# Patient Record
Sex: Female | Born: 1960 | Race: Black or African American | Hispanic: No | Marital: Single | Smoking: Never smoker
Health system: Southern US, Community
[De-identification: ages and names within clinical notes are randomized; demographics above are authoritative.]

## PROBLEM LIST (undated history)

## (undated) DIAGNOSIS — R42 Dizziness and giddiness: Secondary | ICD-10-CM

## (undated) DIAGNOSIS — N390 Urinary tract infection, site not specified: Secondary | ICD-10-CM

## (undated) DIAGNOSIS — J329 Chronic sinusitis, unspecified: Secondary | ICD-10-CM

## (undated) DIAGNOSIS — I639 Cerebral infarction, unspecified: Secondary | ICD-10-CM

## (undated) DIAGNOSIS — E785 Hyperlipidemia, unspecified: Secondary | ICD-10-CM

## (undated) HISTORY — PX: UTERINE FIBROID SURGERY: SHX826

---

## 2015-01-03 ENCOUNTER — Emergency Department (HOSPITAL_COMMUNITY): Payer: Medicaid Other

## 2015-01-03 ENCOUNTER — Inpatient Hospital Stay (HOSPITAL_COMMUNITY)
Admission: EM | Admit: 2015-01-03 | Discharge: 2015-01-05 | DRG: 065 | Disposition: A | Discharge status: Home / Self Care | Payer: Medicaid Other | Attending: Family Medicine | Admitting: Family Medicine

## 2015-01-03 ENCOUNTER — Encounter (HOSPITAL_COMMUNITY): Payer: Self-pay | Admitting: Emergency Medicine

## 2015-01-03 ENCOUNTER — Inpatient Hospital Stay (HOSPITAL_COMMUNITY): Payer: Medicaid Other

## 2015-01-03 DIAGNOSIS — Z823 Family history of stroke: Secondary | ICD-10-CM

## 2015-01-03 DIAGNOSIS — I63512 Cerebral infarction due to unspecified occlusion or stenosis of left middle cerebral artery: Principal | ICD-10-CM | POA: Diagnosis present

## 2015-01-03 DIAGNOSIS — G8191 Hemiplegia, unspecified affecting right dominant side: Secondary | ICD-10-CM | POA: Diagnosis present

## 2015-01-03 DIAGNOSIS — Z809 Family history of malignant neoplasm, unspecified: Secondary | ICD-10-CM | POA: Diagnosis not present

## 2015-01-03 DIAGNOSIS — I6521 Occlusion and stenosis of right carotid artery: Secondary | ICD-10-CM | POA: Diagnosis present

## 2015-01-03 DIAGNOSIS — I6529 Occlusion and stenosis of unspecified carotid artery: Secondary | ICD-10-CM

## 2015-01-03 DIAGNOSIS — R4781 Slurred speech: Secondary | ICD-10-CM | POA: Diagnosis present

## 2015-01-03 DIAGNOSIS — B373 Candidiasis of vulva and vagina: Secondary | ICD-10-CM | POA: Diagnosis present

## 2015-01-03 DIAGNOSIS — I639 Cerebral infarction, unspecified: Secondary | ICD-10-CM | POA: Diagnosis present

## 2015-01-03 DIAGNOSIS — E785 Hyperlipidemia, unspecified: Secondary | ICD-10-CM | POA: Diagnosis present

## 2015-01-03 DIAGNOSIS — R4701 Aphasia: Secondary | ICD-10-CM | POA: Diagnosis present

## 2015-01-03 HISTORY — DX: Dizziness and giddiness: R42

## 2015-01-03 HISTORY — DX: Hyperlipidemia, unspecified: E78.5

## 2015-01-03 LAB — DIFFERENTIAL
BASOS ABS: 0 10*3/uL (ref 0.0–0.1)
BASOS PCT: 1 %
Eosinophils Absolute: 0.2 10*3/uL (ref 0.0–0.7)
Eosinophils Relative: 3 %
Lymphocytes Relative: 44 %
Lymphs Abs: 2.1 10*3/uL (ref 0.7–4.0)
MONOS PCT: 5 %
Monocytes Absolute: 0.3 10*3/uL (ref 0.1–1.0)
NEUTROS ABS: 2.2 10*3/uL (ref 1.7–7.7)
Neutrophils Relative %: 47 %

## 2015-01-03 LAB — RAPID URINE DRUG SCREEN, HOSP PERFORMED
AMPHETAMINES: NOT DETECTED
BARBITURATES: NOT DETECTED
BENZODIAZEPINES: NOT DETECTED
Cocaine: NOT DETECTED
Opiates: NOT DETECTED
TETRAHYDROCANNABINOL: NOT DETECTED

## 2015-01-03 LAB — I-STAT CHEM 8, ED
BUN: 11 mg/dL (ref 6–20)
CHLORIDE: 104 mmol/L (ref 101–111)
CREATININE: 0.8 mg/dL (ref 0.44–1.00)
Calcium, Ion: 1.18 mmol/L (ref 1.12–1.23)
GLUCOSE: 115 mg/dL — AB (ref 65–99)
HCT: 40 % (ref 36.0–46.0)
Hemoglobin: 13.6 g/dL (ref 12.0–15.0)
POTASSIUM: 3.7 mmol/L (ref 3.5–5.1)
Sodium: 144 mmol/L (ref 135–145)
TCO2: 29 mmol/L (ref 0–100)

## 2015-01-03 LAB — CBC
HEMATOCRIT: 36.9 % (ref 36.0–46.0)
HEMOGLOBIN: 11.5 g/dL — AB (ref 12.0–15.0)
MCH: 23.4 pg — ABNORMAL LOW (ref 26.0–34.0)
MCHC: 31.2 g/dL (ref 30.0–36.0)
MCV: 75.2 fL — ABNORMAL LOW (ref 78.0–100.0)
Platelets: 371 10*3/uL (ref 150–400)
RBC: 4.91 MIL/uL (ref 3.87–5.11)
RDW: 14.8 % (ref 11.5–15.5)
WBC: 4.8 10*3/uL (ref 4.0–10.5)

## 2015-01-03 LAB — COMPREHENSIVE METABOLIC PANEL
ALT: 19 U/L (ref 14–54)
AST: 16 U/L (ref 15–41)
Albumin: 3.8 g/dL (ref 3.5–5.0)
Alkaline Phosphatase: 65 U/L (ref 38–126)
Anion gap: 7 (ref 5–15)
BUN: 12 mg/dL (ref 6–20)
CHLORIDE: 105 mmol/L (ref 101–111)
CO2: 28 mmol/L (ref 22–32)
CREATININE: 0.83 mg/dL (ref 0.44–1.00)
Calcium: 8.9 mg/dL (ref 8.9–10.3)
GFR calc Af Amer: >60 mL/min (ref >60)
Glucose, Bld: 116 mg/dL — ABNORMAL HIGH (ref 65–99)
Potassium: 3.5 mmol/L (ref 3.5–5.1)
Sodium: 140 mmol/L (ref 135–145)
Total Bilirubin: 0.3 mg/dL (ref 0.3–1.2)
Total Protein: 7.5 g/dL (ref 6.5–8.1)

## 2015-01-03 LAB — I-STAT TROPONIN, ED: TROPONIN I, POC: 0 ng/mL (ref 0.00–0.08)

## 2015-01-03 LAB — APTT: APTT: 29 s (ref 24–37)

## 2015-01-03 LAB — URINE MICROSCOPIC-ADD ON
BACTERIA UA: NONE SEEN
RBC / HPF: NONE SEEN RBC/hpf (ref 0–5)
Squamous Epithelial / LPF: NONE SEEN

## 2015-01-03 LAB — URINALYSIS, ROUTINE W REFLEX MICROSCOPIC
Bilirubin Urine: NEGATIVE
GLUCOSE, UA: NEGATIVE mg/dL
HGB URINE DIPSTICK: NEGATIVE
Ketones, ur: NEGATIVE mg/dL
Nitrite: NEGATIVE
PH: 5.5 (ref 5.0–8.0)
Protein, ur: NEGATIVE mg/dL
SPECIFIC GRAVITY, URINE: 1.01 (ref 1.005–1.030)

## 2015-01-03 LAB — PROTIME-INR
INR: 1.03 (ref 0.00–1.49)
Prothrombin Time: 13.7 seconds (ref 11.6–15.2)

## 2015-01-03 LAB — ETHANOL

## 2015-01-03 LAB — CBG MONITORING, ED: Glucose-Capillary: 99 mg/dL (ref 65–99)

## 2015-01-03 MED ORDER — ENOXAPARIN SODIUM 40 MG/0.4ML ~~LOC~~ SOLN
40.0000 mg | SUBCUTANEOUS | Status: DC
  Filled 2015-01-03: qty 0.4

## 2015-01-03 MED ORDER — LORAZEPAM 2 MG/ML IJ SOLN
1.0000 mg | Freq: Once | INTRAMUSCULAR | Status: AC
  Administered 2015-01-03: 1 mg via INTRAVENOUS
  Filled 2015-01-03: qty 1

## 2015-01-03 MED ORDER — STROKE: EARLY STAGES OF RECOVERY BOOK
Freq: Once | Status: DC
  Filled 2015-01-03: qty 1

## 2015-01-03 MED ORDER — ATORVASTATIN CALCIUM 40 MG PO TABS
40.0000 mg | ORAL_TABLET | Freq: Every day | ORAL | Status: DC
  Administered 2015-01-03: 40 mg via ORAL
  Filled 2015-01-03: qty 1

## 2015-01-03 MED ORDER — ASPIRIN 300 MG RE SUPP
300.0000 mg | Freq: Every day | RECTAL | Status: DC
Start: 2015-01-03 — End: 2015-01-04

## 2015-01-03 MED ORDER — ASPIRIN 325 MG PO TABS
325.0000 mg | ORAL_TABLET | Freq: Every day | ORAL | Status: DC
  Administered 2015-01-03 – 2015-01-05 (×3): 325 mg via ORAL
  Filled 2015-01-03 (×3): qty 1

## 2015-01-03 NOTE — H&P (Addendum)
History and Physical  Megan Holloway ZOX:[id number] DOB: [birth year] DOA: 01/03/2015  Referring physician: Glynn Octave, MD. PCP: No PCP Per Patient   Chief Complaint: Aphasia, Numbness.   HPI: 54 y/o female with a hx of vertigo presents with weakness in her right hand, numbness in her right thumb, and slurred speech. While in the ED, labs were unremarkable. 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 She will be admitted for further workup and management.   Patient states she woke up on 12/26 with numbness in her right hand. She then began to gradually develop worsening weakness in her right hand and was unable to grip anything. This morning upon waking up around 6:30am, she began having slurred speech. Upon arrival to the ED around 9am, she reports her speech was back to normal. She denies any LE weakness, HA, CP, difficulty swallowing, SOB, visual changes, nausea, vomiting, or abdominal pain.   In the emergency department VSS, afebrile, not hypoxic Pertinent labs:CMP, troponin, CBC, UDS, alcohol unremarkable  EKG: Independently reviewed. SR.  Review of Systems:  Positive for right thumb numbness, right hand weakness, slurred speech. Negative for fever, visual changes, sore throat, rash, new muscle aches, chest pain, SOB, dysuria, bleeding, n/v/abdominal pain.  Past Medical History  Diagnosis Date  . Vertigo     Past Surgical History  Procedure Laterality Date  . Uterine fibroid surgery    . Cesarean section      Social History:  reports that she has never smoked. She does not have any smokeless tobacco history on file. She reports that she does not drink alcohol or use illicit drugs. lives with their family Self-care  Allergies  Allergen Reactions  . Sulfa Antibiotics Hives  . Penicillins Rash    Has patient had a PCN reaction causing immediate rash,  facial/tongue/throat swelling, SOB or lightheadedness with hypotension: No Has patient had a PCN reaction causing severe rash involving mucus membranes or skin necrosis: No Has patient had a PCN reaction that required hospitalization No Has patient had a PCN reaction occurring within the last 10 years: No If all of the above answers are "NO", then may proceed with Cephalosporin use.     Family History  Problem Relation Age of Onset  . Stroke Mother   . Cancer Father   . Stroke Brother      Prior to Admission medications   Not on File   Physical Exam: Filed Vitals:   01/03/15 0900 01/03/15 0930 01/03/15 1133 01/03/15 1148  BP: 178/91 155/75 144/95   Pulse: 79 73 70   Temp:    98.3 F (36.8 C)  TempSrc:      Resp: 29 19 16    Height:      Weight:      SpO2: 99% 100% 100%     VSS, afebrile General:  Appears calm and comfortable Eyes: PERRL, normal lids, irises & conjunctiva ENT: grossly normal hearing, lips & tongue Neck: no LAD, masses or thyromegaly Cardiovascular: RRR, no m/r/g. No LE edema. Telemetry: SR, no arrhythmias  Respiratory: CTA bilaterally, no w/r/r. Normal respiratory effort. Abdomen: soft, ntnd Skin: no rash or induration noted Musculoskeletal: strength BLE is normal, sensation is normal, LUE normal, RUE normal strength in shoulder and upper arm. Strength in right lower arm and hand is 4/5. Weakness in interosseous muscles, grip strength 4+/5 Psychiatric: grossly normal mood and affect, speech fluent and appropriate Neurologic: CN 2-12 intact,  no pronator drift   Wt Readings from Last 3 Encounters:  01/03/15 86.183 kg (190 lb)    Labs on Admission:  Basic Metabolic Panel:  Recent Labs Lab 01/03/15 0920 01/03/15 0925  NA 140 144  K 3.5 3.7  CL 105 104  CO2 28  --   GLUCOSE 116* 115*  BUN 12 11  CREATININE 0.83 0.80  CALCIUM 8.9  --     Liver Function Tests:  Recent Labs Lab 01/03/15 0920  AST 16  ALT 19  ALKPHOS 65  BILITOT 0.3    PROT 7.5  ALBUMIN 3.8     CBC:  Recent Labs Lab 01/03/15 0920 01/03/15 0925  WBC 4.8  --   NEUTROABS 2.2  --   HGB 11.5* 13.6  HCT 36.9 40.0  MCV 75.2*  --   PLT 371  --      Troponin (Point of Care Test)  Recent Labs  01/03/15 0923  TROPIPOC 0.00    CBG:  Recent Labs Lab 01/03/15 0910  GLUCAP 99     Radiological Exams on Admission: Ct Head Holloway Contrast  01/03/2015  CLINICAL DATA:  Slurred speech and RIGHT hand numbness since 01/01/2015. EXAM: CT HEAD WITHOUT CONTRAST TECHNIQUE: Contiguous axial images were obtained from the base of the skull through the vertex without intravenous contrast. COMPARISON:  None. FINDINGS: No evidence for acute infarction, hemorrhage, mass lesion, hydrocephalus, or extra-axial fluid. No cortical atrophy or large vessel infarct. Slight prominence superior vermian cistern, could indicate early cerebellar atrophy. There may be mild chronic microvascular ischemic change. No asymmetric cortical or deep nuclei hypodensity. No CT signs of large vessel occlusion. Mild vascular calcification. No osseous findings. Negative sinuses and mastoids. No orbital abnormalities are evident. IMPRESSION: Negative exam.  No cause is seen of the described symptoms. Electronically Signed   By: Elsie StainJohn T Curnes M.D.   On: 01/03/2015 10:02   Mr Megan GlennMra Head Holloway Contrast  01/03/2015  CLINICAL DATA:  Slurred speech and right arm numbness, 1 day duration. EXAM: MRI HEAD WITHOUT CONTRAST MRA HEAD WITHOUT CONTRAST TECHNIQUE: Multiplanar, multiecho pulse sequences of the Megan and surrounding structures were obtained without intravenous contrast. Angiographic images of the head were obtained using MRA technique without contrast. COMPARISON:  Head CT same day FINDINGS: MRI HEAD FINDINGS Diffusion imaging shows clustered sub cm foci of acute infarction affecting the left parietal cortical and subcortical Megan consistent with left MCA branch vessel territory occlusion. No large  confluent infarction. No other vascular territory involvement. No swelling or hemorrhage. The brainstem the cerebellum are normal. No abnormality of the right hemisphere. No mass lesion. No hydrocephalus or extra-axial collection. No pituitary mass. No inflammatory sinus disease. No skull or skullbase lesion. MRA HEAD FINDINGS The right internal carotid artery is a small vessel but does continue to show antegrade flow, suggesting a high-grade stenosis at the right carotid bifurcation. The right internal carotid artery in the supraclinoid region is reconstituted by a large posterior communicating artery. The vessel then goes on to supplied the right middle cerebral artery territory. The left internal carotid artery is patent through the skullbase. There is a 50% stenosis of the proximal siphon. This vessel supplies the left middle cerebral artery territory and both anterior cerebral artery territories. There are missing MCA branch vessels in the parietal region consistent with a region of infarction. There is stenosis of 1 of the 2 visible MCA branch vessels. Both vertebral arteries are patent through the foramen magnum. There is 50% stenosis of the  left vertebral artery at the foramen magnum. Posterior inferior cerebellar arteries show flow. No basilar stenosis. Superior cerebellar arteries and posterior cerebral arteries show flow. There is stenosis of the right P1 segment at the takeoff of the posterior communicating artery. 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 Supply to the right hemisphere drives primarily from flow through a patent posterior communicating artery on the right serving the MCA territory and through a patent anterior communicating  artery from the left carotid. Missing MCA branch vessels on the left consistent with occlusion. One of the 2 visible main MCA branch vessels shows proximal stenosis. 50% stenosis of the left vertebral artery at the foramen magnum. Stenosis of the right P1 segment at the takeoff of the right posterior communicating artery. Therefore, the right anterior circulation is at risk from this stenosis as this vessel is the main supplier of the right MCA territory. Electronically Signed   By: Paulina Fusi M.D.   On: 01/03/2015 11:17   Mr Megan Holloway Contrast  01/03/2015  CLINICAL DATA:  Slurred speech and right arm numbness, 1 day duration. EXAM: MRI HEAD WITHOUT CONTRAST MRA HEAD WITHOUT CONTRAST TECHNIQUE: Multiplanar, multiecho pulse sequences of the Megan and surrounding structures were obtained without intravenous contrast. Angiographic images of the head were obtained using MRA technique without contrast. COMPARISON:  Head CT same day FINDINGS: MRI HEAD FINDINGS Diffusion imaging shows clustered sub cm foci of acute infarction affecting the left parietal cortical and subcortical Megan consistent with left MCA branch vessel territory occlusion. No large confluent infarction. No other vascular territory involvement. No swelling or hemorrhage. The brainstem the cerebellum are normal. No abnormality of the right hemisphere. No mass lesion. No hydrocephalus or extra-axial collection. No pituitary mass. No inflammatory sinus disease. No skull or skullbase lesion. MRA HEAD FINDINGS The right internal carotid artery is a small vessel but does continue to show antegrade flow, suggesting a high-grade stenosis at the right carotid bifurcation. The right internal carotid artery in the supraclinoid region is reconstituted by a large posterior communicating artery. The vessel then goes on to supplied the right middle cerebral artery territory. The left internal carotid artery is patent through the skullbase. There is a 50% stenosis  of the proximal siphon. This vessel supplies the left middle cerebral artery territory and both anterior cerebral artery territories. There are missing MCA branch vessels in the parietal region consistent with a region of infarction. There is stenosis of 1 of the 2 visible MCA branch vessels. Both vertebral arteries are patent through the foramen magnum. There is 50% stenosis of the left vertebral artery at the foramen magnum. Posterior inferior cerebellar arteries show flow. No basilar stenosis. Superior cerebellar arteries and posterior cerebral arteries show flow. There is stenosis of the right P1 segment at the takeoff of the posterior communicating artery. 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 Supply to the right hemisphere drives primarily from flow through a patent posterior communicating artery on the right serving the MCA territory and through a patent anterior communicating artery from the left carotid. Missing  MCA branch vessels on the left consistent with occlusion. One of the 2 visible main MCA branch vessels shows proximal stenosis. 50% stenosis of the left vertebral artery at the foramen magnum. Stenosis of the right P1 segment at the takeoff of the right posterior communicating artery. Therefore, the right anterior circulation is at risk from this stenosis as this vessel is the main supplier of the right MCA territory. Electronically Signed   By: Paulina Fusi M.D.   On: 01/03/2015 11:17      Principal Problem:   CVA (cerebral infarction)   Assessment/Plan 1. 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 Slurred speech resolved. Not on anticoagulation or ASA as outpatient. Discussed with Dr. Roseanne Reno, neurology at [facility], not a candidate for any intervetion. Will order ECHO, lipid profile, and Hgb A1C. Will consult PT and OT. Start on ASA.  2. 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 Will check carotid dopplers.    Admit to telemetry  Stroke workup. Start ASA, statin.  Therapy evaluations.  Code Status: Full  DVT prophylaxis: Lovenox Family Communication: Boyfriend at beside. Discussed care plan with patient with permission.  Disposition Plan/Anticipated LOS: Admit to medical floor.   Time spent: 55 minutes  Brendia Sacks, MD  [facility] Pager [phone] 01/03/2015, 12:11 PM    By signing my name below, I, Burnett Harry attest that this documentation has been prepared under the direction and in the presence of Brendia Sacks, MD Electronically signed: Burnett Harry, Scribe.  01/03/2015 11:53am  I personally performed the services described in this documentation. All medical record entries made by the scribe were at my direction. I have reviewed the chart and agree that the record reflects my personal performance and is accurate and complete. Brendia Sacks, MD

## 2015-01-03 NOTE — ED Notes (Addendum)
Patient complaining of right hand numbness and weakness starting yesterday with slurred speech upon awakening at 0630 this morning. No facial droop, strong and equal grips bilaterally. Patient ambulatory with no assistance or difficulty at triage.

## 2015-01-03 NOTE — ED Notes (Signed)
Pt taken to MRI  

## 2015-01-03 NOTE — Consult Note (Addendum)
[place] A. Merlene Laughter, MD     [email or web address]          Megan Holloway is an 54 y.o. female.   ASSESSMENT/PLAN:  The patient is a 54 year old right-handed black female who presents with acute numbness and the impaired dexterity on the right hand over the last 2 days. She also seemed to have difficulties with dysarthria/aphasia with the patient reporting difficulties with finding words. It appears that her speaking impairment has improved but she still is left with significant numbness and the hand dexterity impairment of the right. She did report having some dizziness. No headaches as reported. No shortness of breath, headaches or loss of consciousness. She reports that she's been relatively healthy. She has not seen a doctor a consistent basis however. She takes no medications. There is no history of nicotine use. The review of systems otherwise negative.  GENERAL: Pleasant female in no acute distress.  HEENT: Supple. Atraumatic normocephalic.   ABDOMEN: soft  EXTREMITIES: No edema   BACK: Normal.  SKIN: Normal by inspection.    MENTAL STATUS: Alert and oriented. She states her age and the month appropriately. Speech, language and cognition are generally intact. Judgment and insight normal. No language impairment with good naming and repetition.  CRANIAL NERVES: Pupils are equal, round and reactive to light and accommodation; extra ocular movements are full, there is no significant nystagmus; visual fields are full; upper and lower facial muscles are normal in strength and symmetric, there is no flattening of the nasolabial folds; tongue is midline; uvula is midline; shoulder elevation is normal.  MOTOR: Normal tone, bulk and strength; no pronator drift. There is no drift of the legs.  There is mild impairment of hand extra changes involving the right hand.  COORDINATION: Left finger to nose is normal, right finger to nose is normal, No rest tremor; no intention  tremor; no postural tremor; no bradykinesia.  REFLEXES: Deep tendon reflexes are symmetrical and normal. Babinski reflexes are flexor bilaterally.   SENSATION: Normal to light touch. There is no tactile or visual extinction on double simultaneous stimulation. NIH stroke scale 0.  1. 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 Given the patient's anatomy thromboembolism is more likely. 2. Asymptomatic critical intracranial right ICA stenosis. 3. Multivessel intracranial occlusive disease.     RECOMMENDATION: The patient should be managed vigorously with medical treatment as opposed interventional treatment. The patient should be placed on dual antiplatelet agents for 6 months. Subsequently, the patient should be placed in a single agent preferably aspirin 325. Agree with lipid panel and hemoglobin A1c. The patient should be placed on a statin however. CTA of head - neck Follow-up echocardiography. Additional labs: RPR, HIV, ESR, CRP,TSH, homocysteine and Vit B12       Blood pressure 149/82, pulse 79, temperature 98.1 F (36.7 C), temperature source Oral, resp. rate 16, height '5\' 5"'$  (1.651 m), weight 86.183 kg (190 lb), SpO2 100 %.  Past Medical History  Diagnosis Date  . Vertigo     Past Surgical History  Procedure Laterality Date  . Uterine fibroid surgery    . Cesarean section      Family History  Problem Relation Age of Onset  . Stroke Mother   . Cancer Father   . Stroke Brother     Social History:  reports that she has never smoked. She does not have any smokeless tobacco history on file. She reports that she does not drink alcohol or use illicit  drugs.  Allergies:  Allergies  Allergen Reactions  . Sulfa Antibiotics Hives  . Penicillins Rash    Has patient had a PCN reaction causing immediate rash, facial/tongue/throat swelling, SOB or lightheadedness with hypotension: No Has patient had a PCN reaction causing  severe rash involving mucus membranes or skin necrosis: No Has patient had a PCN reaction that required hospitalization No Has patient had a PCN reaction occurring within the last 10 years: No If all of the above answers are "NO", then may proceed with Cephalosporin use.     Medications: Prior to Admission medications   Medication Sig Start Date End Date Taking? Authorizing Provider  clotrimazole (LOTRIMIN) 1 % cream Apply 1 application topically 2 (two) times daily.   Yes Historical Provider, MD  ibuprofen (ADVIL,MOTRIN) 600 MG tablet Take 600 mg by mouth every 6 (six) hours as needed for moderate pain.   Yes Historical Provider, MD  meclizine (ANTIVERT) 12.5 MG tablet Take 12.5 mg by mouth daily as needed for dizziness.   Yes Historical Provider, MD  montelukast (SINGULAIR) 10 MG tablet Take 10 mg by mouth every other day.   Yes Historical Provider, MD    Scheduled Meds: .  stroke: mapping our early stages of recovery book   Does not apply Once  . aspirin  300 mg Rectal Daily   Or  . aspirin  325 mg Oral Daily  . atorvastatin  40 mg Oral q1800  . enoxaparin (LOVENOX) injection  40 mg Subcutaneous Q24H   Continuous Infusions:  PRN Meds:.     Results for orders placed or performed during the hospital encounter of 01/03/15 (from the past 48 hour(s))  CBG monitoring, ED     Status: None   Collection Time: 01/03/15  9:10 AM  Result Value Ref Range   Glucose-Capillary 99 65 - 99 mg/dL  Urine rapid drug screen (hosp performed)not at [facility]     Status: None   Collection Time: 01/03/15  9:19 AM  Result Value Ref Range   Opiates NONE DETECTED NONE DETECTED   Cocaine NONE DETECTED NONE DETECTED   Benzodiazepines NONE DETECTED NONE DETECTED   Amphetamines NONE DETECTED NONE DETECTED   Tetrahydrocannabinol NONE DETECTED NONE DETECTED   Barbiturates NONE DETECTED NONE DETECTED    Comment:        DRUG SCREEN FOR MEDICAL PURPOSES ONLY.  IF CONFIRMATION IS NEEDED FOR ANY PURPOSE, NOTIFY  LAB WITHIN 5 DAYS.        LOWEST DETECTABLE LIMITS FOR URINE DRUG SCREEN Drug Class       Cutoff (ng/mL) Amphetamine      1000 Barbiturate      200 Benzodiazepine   517 Tricyclics       001 Opiates          300 Cocaine          300 THC              50   Urinalysis, Routine w reflex microscopic (not at [facility])     Status: Abnormal   Collection Time: 01/03/15  9:19 AM  Result Value Ref Range   Color, Urine YELLOW YELLOW   APPearance CLEAR CLEAR   Specific Gravity, Urine 1.010 1.005 - 1.030   pH 5.5 5.0 - 8.0   Glucose, UA NEGATIVE NEGATIVE mg/dL   Hgb urine dipstick NEGATIVE NEGATIVE   Bilirubin Urine NEGATIVE NEGATIVE   Ketones, ur NEGATIVE NEGATIVE mg/dL   Protein, ur NEGATIVE NEGATIVE mg/dL   Nitrite NEGATIVE NEGATIVE  Leukocytes, UA SMALL (A) NEGATIVE  Urine microscopic-add on     Status: None   Collection Time: 01/03/15  9:19 AM  Result Value Ref Range   Squamous Epithelial / LPF NONE SEEN NONE SEEN   WBC, UA 0-5 0 - 5 WBC/hpf   RBC / HPF NONE SEEN 0 - 5 RBC/hpf   Bacteria, UA NONE SEEN NONE SEEN  Ethanol     Status: None   Collection Time: 01/03/15  9:20 AM  Result Value Ref Range   Alcohol, Ethyl (B) <5 <5 mg/dL    Comment:        LOWEST DETECTABLE LIMIT FOR SERUM ALCOHOL IS 5 mg/dL FOR MEDICAL PURPOSES ONLY   Protime-INR     Status: None   Collection Time: 01/03/15  9:20 AM  Result Value Ref Range   Prothrombin Time 13.7 11.6 - 15.2 seconds   INR 1.03 0.00 - 1.49  APTT     Status: None   Collection Time: 01/03/15  9:20 AM  Result Value Ref Range   aPTT 29 24 - 37 seconds  CBC     Status: Abnormal   Collection Time: 01/03/15  9:20 AM  Result Value Ref Range   WBC 4.8 4.0 - 10.5 K/uL   RBC 4.91 3.87 - 5.11 MIL/uL   Hemoglobin 11.5 (L) 12.0 - 15.0 g/dL   HCT 36.9 36.0 - 46.0 %   MCV 75.2 (L) 78.0 - 100.0 fL   MCH 23.4 (L) 26.0 - 34.0 pg   MCHC 31.2 30.0 - 36.0 g/dL   RDW 14.8 11.5 - 15.5 %   Platelets 371 150 - 400 K/uL  Differential     Status: None    Collection Time: 01/03/15  9:20 AM  Result Value Ref Range   Neutrophils Relative % 47 %   Neutro Abs 2.2 1.7 - 7.7 K/uL   Lymphocytes Relative 44 %   Lymphs Abs 2.1 0.7 - 4.0 K/uL   Monocytes Relative 5 %   Monocytes Absolute 0.3 0.1 - 1.0 K/uL   Eosinophils Relative 3 %   Eosinophils Absolute 0.2 0.0 - 0.7 K/uL   Basophils Relative 1 %   Basophils Absolute 0.0 0.0 - 0.1 K/uL  Comprehensive metabolic panel     Status: Abnormal   Collection Time: 01/03/15  9:20 AM  Result Value Ref Range   Sodium 140 135 - 145 mmol/L   Potassium 3.5 3.5 - 5.1 mmol/L   Chloride 105 101 - 111 mmol/L   CO2 28 22 - 32 mmol/L   Glucose, Bld 116 (H) 65 - 99 mg/dL   BUN 12 6 - 20 mg/dL   Creatinine, Ser 0.83 0.44 - 1.00 mg/dL   Calcium 8.9 8.9 - 10.3 mg/dL   Total Protein 7.5 6.5 - 8.1 g/dL   Albumin 3.8 3.5 - 5.0 g/dL   AST 16 15 - 41 U/L   ALT 19 14 - 54 U/L   Alkaline Phosphatase 65 38 - 126 U/L   Total Bilirubin 0.3 0.3 - 1.2 mg/dL   GFR calc non Af Amer >60 >60 mL/min   GFR calc Af Amer >60 >60 mL/min    Comment: (NOTE) The eGFR has been calculated using the CKD EPI equation. This calculation has not been validated in all clinical situations. eGFR's persistently <60 mL/min signify possible Chronic Kidney Disease.    Anion gap 7 5 - 15  I-stat troponin, ED (not at [facility])     Status: None   Collection  Time: 01/03/15  9:23 AM  Result Value Ref Range   Troponin i, poc 0.00 0.00 - 0.08 ng/mL   Comment 3            Comment: Due to the release kinetics of cTnI, a negative result within the first hours of the onset of symptoms does not rule out myocardial infarction with certainty. If myocardial infarction is still suspected, repeat the test at appropriate intervals.   I-Stat Chem 8, ED  (not at [facility])     Status: Abnormal   Collection Time: 01/03/15  9:25 AM  Result Value Ref Range   Sodium 144 135 - 145 mmol/L   Potassium 3.7 3.5 - 5.1 mmol/L   Chloride 104 101 - 111 mmol/L     BUN 11 6 - 20 mg/dL   Creatinine, Ser 0.80 0.44 - 1.00 mg/dL   Glucose, Bld 115 (H) 65 - 99 mg/dL   Calcium, Ion 1.18 1.12 - 1.23 mmol/L   TCO2 29 0 - 100 mmol/L   Hemoglobin 13.6 12.0 - 15.0 g/dL   HCT 40.0 36.0 - 46.0 %    Studies/Results:  CAROTID DOPPLERS: Mild atherosclerotic disease in the carotid arteries bilaterally. Based on the peak systolic velocities, estimated degree of stenosis in the internal carotid arteries is less than 50%. However, peak systolic velocities in the internal carotid arteries are low on both sides, right side greater the left. Reason for these low velocities is uncertain. There does appear to be diminished flow to the right internal carotid artery based on the recent MRA examination. Recommend further evaluation of the great vessels and carotid arteries with a neck MRA examination.     BRAIN MRI/MRA Clustered sub cm foci of acute infarction affecting the left parietal cortical and subcortical brain consistent with left MCA branch vessel territory occlusion or emboli.   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 Supply to the right hemisphere drives primarily from flow through a patent posterior communicating artery on the right serving the MCA territory and through a patent anterior communicating artery from the left carotid.   Missing MCA branch vessels on the left consistent with occlusion. One of the 2 visible main MCA branch vessels shows proximal stenosis.   50% stenosis of the left vertebral artery at the foramen magnum. Stenosis of the right P1 segment at the takeoff of the right posterior communicating artery. Therefore, the right anterior circulation is at risk from this stenosis as this vessel is the main supplier of the right MCA territory.     The brain MRI and MRA are reviewed in person. There are multiple scattered increased signal seen on  diffusion imaging and involving the left parietal and frontal regions consistent with embolic phenomenon. This could be vessel to vessel thromboembolic is a or cardioembolism. MRA shows critical stenosis of the right intracranial ICA near the trifurcation. There is also absent/occlusion of the left PCOM and the right A1 segment. There is also moderate stenosis of the left vertebral artery. There is also a branch occlusion of left MCA.     Dolora Ridgely A. Merlene Laughter, M.D.  Diplomate, Tax adviser of Psychiatry and Neurology ( Neurology). 01/03/2015, 6:22 PM

## 2015-01-03 NOTE — ED Notes (Signed)
Notified by MRI that pt screaming at top of lungs and trying to climb out of the MRI scanner.  Medicated as ordered in MRI, no scanner available.

## 2015-01-03 NOTE — ED Provider Notes (Signed)
CSN: [id number]     Arrival date & time 01/03/15  0845 History  By signing my name below, I, Tanda Rockers, attest that this documentation has been prepared under the direction and in the presence of Glynn Octave, MD. Electronically Signed: Tanda Rockers, ED Scribe. 01/03/2015. 9:07 AM.   Chief Complaint  Patient presents with  . Aphasia  . Numbness   The history is provided by the patient and a relative. No language interpreter was used.     HPI Comments: Megan Holloway is a 53 y.o. female who presents to the Emergency Department complaining of gradual onset, constant, right thumb numbness that began yesterday morning. Megan Holloway also complains of weakness in her right hand that began shortly after the numbness in her thumb. The weakness is worse today. She reports that when she woke up this morning around 6:30 AM (approxiamtely 2.5 hours ago) she began having slurred speech, prompting her to come to the ED. She states that her speech is back to normal but family member mentions that Megan Holloway's speech is not at baseline. Megan Holloway has never had symptoms like this in the past. She does report that she uses her hands often during work. Denies arthralgias, weakness in lower extremities, headache, chest pain, difficulty swallowing, visual disturbances, abdominal pain, or any other associated symptoms. Megan Holloway is non smoker.   PCP [facility]   Past Medical History  Diagnosis Date  . Vertigo    Past Surgical History  Procedure Laterality Date  . Uterine fibroid surgery    . Cesarean section     Family History  Problem Relation Age of Onset  . Stroke Mother   . Cancer Father   . Stroke Brother    Social History  Substance Use Topics  . Smoking status: Never Smoker   . Smokeless tobacco: None  . Alcohol Use: No   OB History    No data available     Review of Systems  HENT: Negative for trouble swallowing.   Eyes: Negative for visual disturbance.  Respiratory: Negative for shortness of breath.    Cardiovascular: Negative for chest pain.  Gastrointestinal: Negative for abdominal pain.  Musculoskeletal: Negative for myalgias and arthralgias.  Neurological: Positive for weakness and numbness. Negative for headaches.  A complete 10 system review of systems was obtained and all systems are negative except as noted in the HPI and PMH.   Allergies  Sulfa antibiotics and Penicillins  Home Medications   Prior to Admission medications   Medication Sig Start Date End Date Taking? Authorizing Provider  clotrimazole (LOTRIMIN) 1 % cream Apply 1 application topically 2 (two) times daily.   Yes Historical Provider, MD  ibuprofen (ADVIL,MOTRIN) 600 MG tablet Take 600 mg by mouth every 6 (six) hours as needed for moderate pain.   Yes Historical Provider, MD  meclizine (ANTIVERT) 12.5 MG tablet Take 12.5 mg by mouth daily as needed for dizziness.   Yes Historical Provider, MD  montelukast (SINGULAIR) 10 MG tablet Take 10 mg by mouth every other day.   Yes Historical Provider, MD   Triage Vitals: BP 159/83 mmHg  Pulse 79  Temp(Src) 98.3 F (36.8 C) (Oral)  Resp 15  Ht 5\' 5"  (1.651 m)  Wt 190 lb (86.183 kg)  BMI 31.62 kg/m2  SpO2 100%   Physical Exam  Constitutional: She is oriented to person, place, and time. She appears well-developed and well-nourished. No distress.  HENT:  Head: Normocephalic and atraumatic.  Mouth/Throat: Oropharynx is clear and moist. No  oropharyngeal exudate.  Eyes: Conjunctivae and EOM are normal. Pupils are equal, round, and reactive to light.  Neck: Normal range of motion. Neck supple.  No meningismus.  Cardiovascular: Normal rate, regular rhythm, normal heart sounds and intact distal pulses.   No murmur heard. Pulmonary/Chest: Effort normal and breath sounds normal. No respiratory distress.  Abdominal: Soft. There is no tenderness. There is no rebound and no guarding.  Musculoskeletal: Normal range of motion. She exhibits no edema or tenderness.   Neurological: She is alert and oriented to person, place, and time. No cranial nerve deficit. She exhibits normal muscle tone.  No ataxia on finger to nose bilaterally. No pronator drift. Decreased right grip strength. Difficulty extending right thumb. Paresthesias of right hand. No appreciable aphasia. Tongue midline. 4/5 flexion and extension of right forearm.  Skin: Skin is warm.  Psychiatric: She has a normal mood and affect. Her behavior is normal.  Nursing note and vitals reviewed.   ED Course  Procedures (including critical care time)  DIAGNOSTIC STUDIES: Oxygen Saturation is 100% on RA, normal by my interpretation.    COORDINATION OF CARE: 9:02 AM-Discussed treatment plan which includes CT Head, Chem 8, EtOH, Protime INR, APTT, CBC, Differential, CMP, Troponin, Rapid drug screen, UA, and CBG with Megan Holloway at bedside and Megan Holloway agreed to plan.   Labs Review Labs Reviewed  CBC - Abnormal; Notable for the following:    Hemoglobin 11.5 (*)    MCV 75.2 (*)    MCH 23.4 (*)    All other components within normal limits  COMPREHENSIVE METABOLIC PANEL - Abnormal; Notable for the following:    Glucose, Bld 116 (*)    All other components within normal limits  URINALYSIS, ROUTINE W REFLEX MICROSCOPIC (NOT AT [facility]) - Abnormal; Notable for the following:    Leukocytes, UA SMALL (*)    All other components within normal limits  I-STAT CHEM 8, ED - Abnormal; Notable for the following:    Glucose, Bld 115 (*)    All other components within normal limits  ETHANOL  PROTIME-INR  APTT  DIFFERENTIAL  URINE RAPID DRUG SCREEN, HOSP PERFORMED  URINE MICROSCOPIC-ADD ON  I-STAT TROPOININ, ED  CBG MONITORING, ED    Imaging Review Ct Head Wo Contrast  01/03/2015  CLINICAL DATA:  Slurred speech and RIGHT hand numbness since 01/01/2015. EXAM: CT HEAD WITHOUT CONTRAST TECHNIQUE: Contiguous axial images were obtained from the base of the skull through the vertex without intravenous contrast.  COMPARISON:  None. FINDINGS: No evidence for acute infarction, hemorrhage, mass lesion, hydrocephalus, or extra-axial fluid. No cortical atrophy or large vessel infarct. Slight prominence superior vermian cistern, could indicate early cerebellar atrophy. There may be mild chronic microvascular ischemic change. No asymmetric cortical or deep nuclei hypodensity. No CT signs of large vessel occlusion. Mild vascular calcification. No osseous findings. Negative sinuses and mastoids. No orbital abnormalities are evident. IMPRESSION: Negative exam.  No cause is seen of the described symptoms. Electronically Signed   By: Elsie Stain M.D.   On: 01/03/2015 10:02   Mr Maxine Glenn Head Wo Contrast  01/03/2015  CLINICAL DATA:  Slurred speech and right arm numbness, 1 day duration. EXAM: MRI HEAD WITHOUT CONTRAST MRA HEAD WITHOUT CONTRAST TECHNIQUE: Multiplanar, multiecho pulse sequences of the brain and surrounding structures were obtained without intravenous contrast. Angiographic images of the head were obtained using MRA technique without contrast. COMPARISON:  Head CT same day FINDINGS: MRI HEAD FINDINGS Diffusion imaging shows clustered sub cm foci of acute infarction affecting  the left parietal cortical and subcortical brain consistent with left MCA branch vessel territory occlusion. No large confluent infarction. No other vascular territory involvement. No swelling or hemorrhage. The brainstem the cerebellum are normal. No abnormality of the right hemisphere. No mass lesion. No hydrocephalus or extra-axial collection. No pituitary mass. No inflammatory sinus disease. No skull or skullbase lesion. MRA HEAD FINDINGS The right internal carotid artery is a small vessel but does continue to show antegrade flow, suggesting a high-grade stenosis at the right carotid bifurcation. The right internal carotid artery in the supraclinoid region is reconstituted by a large posterior communicating artery. The vessel then goes on to  supplied the right middle cerebral artery territory. The left internal carotid artery is patent through the skullbase. There is a 50% stenosis of the proximal siphon. This vessel supplies the left middle cerebral artery territory and both anterior cerebral artery territories. There are missing MCA branch vessels in the parietal region consistent with a region of infarction. There is stenosis of 1 of the 2 visible MCA branch vessels. Both vertebral arteries are patent through the foramen magnum. There is 50% stenosis of the left vertebral artery at the foramen magnum. Posterior inferior cerebellar arteries show flow. No basilar stenosis. Superior cerebellar arteries and posterior cerebral arteries show flow. There is stenosis of the right P1 segment at the takeoff of the posterior communicating artery. IMPRESSION: Clustered sub cm foci of acute infarction affecting the left parietal cortical and subcortical brain consistent with left MCA branch vessel territory occlusion or emboli. 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 Supply to the right hemisphere drives primarily from flow through a patent posterior communicating artery on the right serving the MCA territory and through a patent anterior communicating artery from the left carotid. Missing MCA branch vessels on the left consistent with occlusion. One of the 2 visible main MCA branch vessels shows proximal stenosis. 50% stenosis of the left vertebral artery at the foramen magnum. Stenosis of the right P1 segment at the takeoff of the right posterior communicating artery. Therefore, the right anterior circulation is at risk from this stenosis as this vessel is the main supplier of the right MCA territory. Electronically Signed   By: Paulina Fusi M.D.   On: 01/03/2015 11:17   Mr Brain Wo Contrast  01/03/2015  CLINICAL DATA:  Slurred speech and right arm numbness, 1 day duration.  EXAM: MRI HEAD WITHOUT CONTRAST MRA HEAD WITHOUT CONTRAST TECHNIQUE: Multiplanar, multiecho pulse sequences of the brain and surrounding structures were obtained without intravenous contrast. Angiographic images of the head were obtained using MRA technique without contrast. COMPARISON:  Head CT same day FINDINGS: MRI HEAD FINDINGS Diffusion imaging shows clustered sub cm foci of acute infarction affecting the left parietal cortical and subcortical brain consistent with left MCA branch vessel territory occlusion. No large confluent infarction. No other vascular territory involvement. No swelling or hemorrhage. The brainstem the cerebellum are normal. No abnormality of the right hemisphere. No mass lesion. No hydrocephalus or extra-axial collection. No pituitary mass. No inflammatory sinus disease. No skull or skullbase lesion. MRA HEAD FINDINGS The right internal carotid artery is a small vessel but does continue to show antegrade flow, suggesting a high-grade stenosis at the right carotid bifurcation. The right internal carotid artery in the supraclinoid region is reconstituted by a large posterior communicating artery. The vessel then goes on to supplied the right middle cerebral artery territory. The left  internal carotid artery is patent through the skullbase. There is a 50% stenosis of the proximal siphon. This vessel supplies the left middle cerebral artery territory and both anterior cerebral artery territories. There are missing MCA branch vessels in the parietal region consistent with a region of infarction. There is stenosis of 1 of the 2 visible MCA branch vessels. Both vertebral arteries are patent through the foramen magnum. There is 50% stenosis of the left vertebral artery at the foramen magnum. Posterior inferior cerebellar arteries show flow. No basilar stenosis. Superior cerebellar arteries and posterior cerebral arteries show flow. There is stenosis of the right P1 segment at the takeoff of the  posterior communicating artery. IMPRESSION: Clustered sub cm foci of acute infarction affecting the left parietal cortical and subcortical brain consistent with left MCA branch vessel territory occlusion or emboli. 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 Supply to the right hemisphere drives primarily from flow through a patent posterior communicating artery on the right serving the MCA territory and through a patent anterior communicating artery from the left carotid. Missing MCA branch vessels on the left consistent with occlusion. One of the 2 visible main MCA branch vessels shows proximal stenosis. 50% stenosis of the left vertebral artery at the foramen magnum. Stenosis of the right P1 segment at the takeoff of the right posterior communicating artery. Therefore, the right anterior circulation is at risk from this stenosis as this vessel is the main supplier of the right MCA territory. Electronically Signed   By: Paulina FusiMark  Shogry M.D.   On: 01/03/2015 11:17     I have personally reviewed and evaluated these images and lab results as part of my medical decision-making.   EKG Interpretation   Date/Time:  Wednesday January 03 2015 08:56:14 EST Ventricular Rate:  76 PR Interval:  131 QRS Duration: 98 QT Interval:  379 QTC Calculation: 426 R Axis:   56 Text Interpretation:  Sinus rhythm Minimal ST depression, inferior leads  Baseline wander in lead(s) II aVR No previous ECGs available Confirmed by  Tereka Thorley  MD, Crist Kruszka [phone] on 01/03/2015 9:18:36 AM      MDM   Final diagnoses:  Cerebral infarction due to unspecified mechanism   Patient with numbness and weakness and right hand onset yesterday. Today she felt she has some slurred speech which has improved. No facial droop. Difficulty extending right hand thumb. Code stroke not activated due to delayed presentation. Last seen normal last night.  CT head is  negative.  Patient with persistent weakness in the right hand and numbness. Her slurred speech seems to have improved.  MRI shows multiple areas of acute infarct left MCA territory. 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  This was discussed with Dr. Gerilyn Pilgrimoonquah neurology. He agrees patient is not a candidate for intervention.  Admission d/w Dr. Irene LimboGoodrich.    does not feel he does not feel the patient is a candidate for intervention.I personally performed the services described in this documentation, which was scribed in my presence. The recorded information has been reviewed and is accurate.      Glynn OctaveStephen Ahmoni Edge, MD 01/03/15 [phone]

## 2015-01-04 ENCOUNTER — Inpatient Hospital Stay (HOSPITAL_COMMUNITY): Payer: Medicaid Other

## 2015-01-04 DIAGNOSIS — E785 Hyperlipidemia, unspecified: Secondary | ICD-10-CM

## 2015-01-04 DIAGNOSIS — I6350 Cerebral infarction due to unspecified occlusion or stenosis of unspecified cerebral artery: Secondary | ICD-10-CM

## 2015-01-04 LAB — LIPID PANEL
Cholesterol: 213 mg/dL — ABNORMAL HIGH (ref 0–200)
HDL: 44 mg/dL (ref >40)
LDL CALC: 139 mg/dL — AB (ref 0–99)
Total CHOL/HDL Ratio: 4.8 RATIO
Triglycerides: 150 mg/dL — ABNORMAL HIGH (ref <150)
VLDL: 30 mg/dL (ref 0–40)

## 2015-01-04 LAB — C-REACTIVE PROTEIN: CRP: 2.2 mg/dL — AB (ref <1.0)

## 2015-01-04 LAB — VITAMIN B12: Vitamin B-12: 344 pg/mL (ref 180–914)

## 2015-01-04 LAB — TSH: TSH: 1.938 u[IU]/mL (ref 0.350–4.500)

## 2015-01-04 LAB — SEDIMENTATION RATE: SED RATE: 10 mm/h (ref 0–22)

## 2015-01-04 MED ORDER — ATORVASTATIN CALCIUM 40 MG PO TABS
80.0000 mg | ORAL_TABLET | Freq: Every day | ORAL | Status: DC
  Administered 2015-01-04: 80 mg via ORAL
  Filled 2015-01-04: qty 2

## 2015-01-04 MED ORDER — CLOPIDOGREL BISULFATE 75 MG PO TABS
75.0000 mg | ORAL_TABLET | Freq: Every day | ORAL | Status: DC
  Administered 2015-01-04 – 2015-01-05 (×2): 75 mg via ORAL
  Filled 2015-01-04 (×2): qty 1

## 2015-01-04 MED ORDER — ACETAMINOPHEN 325 MG PO TABS
650.0000 mg | ORAL_TABLET | Freq: Four times a day (QID) | ORAL | Status: DC | PRN
  Administered 2015-01-04 (×2): 650 mg via ORAL
  Filled 2015-01-04 (×2): qty 2

## 2015-01-04 MED ORDER — IOHEXOL 350 MG/ML SOLN
75.0000 mL | Freq: Once | INTRAVENOUS | Status: AC | PRN
  Administered 2015-01-04: 75 mL via INTRAVENOUS

## 2015-01-04 NOTE — Discharge Summary (Signed)
Physician Discharge Summary  Bayler Nehring ZOX:[id number] DOB: [birth year] DOA: 01/03/2015  PCP: Lianne Bushy  Admit date: 01/03/2015 Discharge date: 01/05/2015  Recommendations for Outpatient Follow-up:  1. Recommend outpatient referral to neurology to follow-up stroke in 1-2 months.  2. Started on ASA, Plavix and Lipitor for stroke. Recommend follow-up LFTs while on statin. Normotensive.   Follow-up Information    Follow up with [facility]. Schedule an appointment as soon as possible for a visit in 1 week.   Specialty:  Internal Medicine   Contact information:   [address] [place] [address] [phone]        Discharge Diagnoses:  1. 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 2. Multivessel intracranial occlusive disease. 3. Dyslipidemia  Discharge Condition: Improved Disposition: Home  Diet recommendation: heart healthy  Filed Weights   01/03/15 0856  Weight: 86.183 kg (190 lb)    History of present illness:  54 y/o female with a hx of vertigo presents with weakness in her right hand, numbness in her right thumb, and slurred speech. While in the ED, labs were unremarkable. MRI/MRA revealed acute infarction affecting the left parietal cortical and subcortical brain consistent with left MCA branch vessel territory occlusion or emboli. She was admitted for further workup and management.   Hospital Course:  Patient presented with right hand numbness, right hand weakness for >48 hours, and a period of slurred speech. Upon arrival to the ED, her slurred speech had resolved. MRI/MRA revealed acute infarction affecting the left parietal cortical and subcortical brain consistent with left MCA branch vessel territory occlusion or emboli. She was not on any anticoagulation or statin as an outpatient. Was not a  candidate for tPA secondary to delay in presentation. Discussed with Dr. Roseanne Reno, neurology at [facility], who stated that she was not a candidate for any intervention. She was started on ASA and a statin. Neurology was consulted and recommended dual antiplatelet agents for 6 months and then transitioning to a single agent such as ASA 325. Labs revealed a LDL of 139. Hgb A1C pending. PT and OT consulted and recommended outpatient OT follow up to rebuild strength. 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  Carotid dopplers revealed mild atherosclerotic disease in the carotid arteries bilaterally.  Individual issues as below:  1. 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 Not on anticoagulation or ASA as outpatient. Not a candidate for any intervention per Dr. Gerilyn Pilgrim. LDL 139. TSH wnl. Hgb A1C 6.8.Echo as below. Neurology recommended dual antiplatelet agents for 6 months.  2. Multivessel intracranial occlusive disease. 3. Dyslipidemia, continue statin. 4. Candidal vulvovaginitis, given one dose of Diflucan as inpatient.  Consultants:  Neurology   OT- outpatient OT  Procedures:  ECHO Study Conclusions  - Left ventricle: The cavity size was normal. Wall thickness was normal. Systolic function was normal. The estimated ejection fraction was in the range of 60% to 65%. Wall motion was normal; there were no regional wall motion abnormalities. Diastolic dysfunction, grade indeterminate, with indeterminate filling pressures. - Mitral valve: Mildly calcified annulus. Normal thickness leaflets . There was mild regurgitation. - Left atrium: The atrium was mildly dilated. - Atrial septum: No defect or patent foramen ovale was identified. - Tricuspid valve: There was mild  regurgitation. - Pulmonary  arteries: Systolic pressure was mildly increased. PA peak pressure: 33 mm Hg (S).  Antibiotics:  none  Discharge Instructions Discharge Instructions    Diet - low sodium heart healthy    Complete by:  As directed      Discharge instructions    Complete by:  As directed   Call your physician or seek immediate medical attention for new weakness, numbness, tingling, facial droop, difficulty swallowing or speaking.     Increase activity slowly    Complete by:  As directed             Discharge Medication List as of 01/05/2015 11:52 AM    START taking these medications   Details  aspirin 325 MG tablet Take 1 tablet (325 mg total) by mouth daily., Starting 01/05/2015, Until Discontinued, No Print    atorvastatin (LIPITOR) 80 MG tablet Take 1 tablet (80 mg total) by mouth daily at 6 PM., Starting 01/05/2015, Until Discontinued, Normal    clopidogrel (PLAVIX) 75 MG tablet Take 1 tablet (75 mg total) by mouth daily., Starting 01/05/2015, Until Discontinued, Normal      CONTINUE these medications which have CHANGED   Details  montelukast (SINGULAIR) 10 MG tablet Take 1 tablet (10 mg total) by mouth every other day., Starting 01/05/2015, Until Discontinued, Normal      CONTINUE these medications which have NOT CHANGED   Details  clotrimazole (LOTRIMIN) 1 % cream Apply 1 application topically 2 (two) times daily., Until Discontinued, Historical Med    meclizine (ANTIVERT) 12.5 MG tablet Take 12.5 mg by mouth daily as needed for dizziness., Until Discontinued, Historical Med      STOP taking these medications     ibuprofen (ADVIL,MOTRIN) 600 MG tablet        Allergies  Allergen Reactions  . Sulfa Antibiotics Hives  . Penicillins Rash    Has patient had a PCN reaction causing immediate rash, facial/tongue/throat swelling, SOB or lightheadedness with hypotension: No Has patient had a PCN reaction causing severe rash involving mucus membranes or skin necrosis: No Has patient  had a PCN reaction that required hospitalization No Has patient had a PCN reaction occurring within the last 10 years: No If all of the above answers are "NO", then may proceed with Cephalosporin use.     The results of significant diagnostics from this hospitalization (including imaging, microbiology, ancillary and laboratory) are listed below for reference.    Significant Diagnostic Studies: Ct Angio Head W/cm &/or Wo Cm  01/04/2015  ADDENDUM REPORT: 01/04/2015 10:47 ADDENDUM: Study discussed by telephone with Dr. Beryle BeamsKOFI DOONQUAH on 01/04/2015 at 1034 hours. Electronically Signed   By: Odessa FlemingH  Hall M.D.   On: 01/04/2015 10:47  01/04/2015  CLINICAL DATA:  [age] year old female with scattered small acute infarcts in the left MCA territory diagnosed following 2 days duration of slurred speech and right arm weakness. Initial encounter. EXAM: CT ANGIOGRAPHY HEAD AND NECK TECHNIQUE: Multidetector CT imaging of the head and neck was performed using the standard protocol during bolus administration of intravenous contrast. Multiplanar CT image reconstructions and MIPs were obtained to evaluate the vascular anatomy. Carotid stenosis measurements (when applicable) are obtained utilizing NASCET criteria, using the distal internal carotid diameter as the denominator. CONTRAST:  75mL OMNIPAQUE IOHEXOL 350 MG/ML SOLN COMPARISON:  Brain MRI and intracranial MRA 01/03/2015. Head CT without contrast 01/03/2015. Carotid ultrasound 01/03/2015. FINDINGS: CT HEAD Brain: Subtle hypodensity in the posterior left frontal lobe corresponding to the larger of the small acute  infarcts seen by MRI (series 2, image 23). Most of the lesions on MRI remain occult on CT. No associated hemorrhage or mass effect. Elsewhere stable and normal gray-white matter differentiation. No acute intracranial hemorrhage identified. No ventriculomegaly. Calvarium and skull base: Intact. Paranasal sinuses: Clear. Orbits: Visualized orbits and scalp soft tissues  are within normal limits. CTA NECK Skeleton: Age congruent degenerative changes in the spine. Much of the dentition is absent. Mild maxillary sinus mucosal thickening. Other neck: Negative lung apices. No superior mediastinal lymphadenopathy. Thyromegaly, no discrete thyroid nodule. Larynx, pharynx, parapharyngeal spaces, retropharyngeal space, sublingual space, submandibular glands, and parotid glands are within normal limits. No cervical lymphadenopathy. Aortic arch: 3 vessel arch configuration. No significant arch atherosclerosis. No great vessel origin stenosis. Right carotid system: Normal right CCA origin. Mildly tortuous proximal right CCA. Mild soft and calcified plaque at the right carotid bifurcation. No right ICA origin or bulb stenosis. However, just distal to the bulb the cervical right ICA is very diminutive but remains patent to the right skullbase. See series 606, images 77-79. Left carotid system: Normal left CCA origin. Mild calcified and soft plaque at the left carotid bifurcation with no left ICA origin or bulb stenosis. Negative cervical left ICA otherwise. Vertebral arteries:No proximal right subclavian artery stenosis. Right vertebral artery appears mildly hyperplastic. Its origin is within normal limits ; mildly obscured by thoracic inlet streak artifact. Normal right vertebral artery to the skullbase. No proximal left subclavian artery stenosis. Normal left vertebral artery origin. Normal size left vertebral artery, normal to the skullbase. CTA HEAD Posterior circulation: Normal right PICA origin. High-grade stenosis left V4 segment at the foramen magnum related to soft and calcified plaque. The vessel remains patent. The left PICA origin is distal to this stenosis and patent. Normal distal right vertebral artery. Vertebrobasilar junction appears normal. No basilar artery stenosis. SCA origins are normal. Mild irregularity at both PCA origins. Stenosis appears mild by CTA. Right posterior  communicating artery is patent. There is mild stenosis at the junction of the right Pcomm and P1 segment (series 602, image 226) bilateral PCA branches are patent, intermittent vessel irregularity mostly on the right side without definite stenosis. Anterior circulation: Dense, virtually complete calcification of the cavernous right ICA segment which appears to be the point of occlusion of that vessel. Reconstituted flow at the right ICA terminus and in the supraclinoid ICA distal to the right ophthalmic artery origin which is not visible. Dense calcified plaque in the cavernous and supraclinoid ICA on the left side. Subsequently high-grade left cavernous segment stenosis is difficult to exclude, but mild or at most moderate stenosis evident here by MRA yesterday. Normal left ophthalmic artery origin. Patent carotid termini. The left A1 segment is dominant, the right is diminutive or absent. Anterior communicating artery is patent. Bilateral ACA branches are within normal limits. Right MCA origin and M1 segment are within normal limits. Right MCA branches are within normal limits. Left MCA origin is normal. Left M1 segment is within normal limits. At the left millicuries bifurcation there is high-grade stenosis at the origin of 1 of the 2 main M2 branches with preserved distal flow (series 602, image 228). The other M2 branch origin remains normal. There is preserved distal flow in both branches. No left MCA branch occlusion identified. Venous sinuses: Patent. Anatomic variants: Dominant left A1 segment, the right A1 is diminutive or absent. Delayed phase: No abnormal enhancement identified. IMPRESSION: 1. With regard to the acute left MCA infarcts: There is HIGH-GRADE STENOSIS  at the origin of the more superior left MCA M2 branch, concordant with yesterday's MRA appearance. No left MCA branch occlusion identified. 2. Severe bilateral ICA siphon calcified plaque. CAVERNOUS RIGHT ICA OCCLUSION on the right. Subsequent  diminutive cervical right ICA distal to the bulb. Reconstituted flow at the right ICA terminus via the right Pcomm. 3. Left ICA siphon high-grade stenosis is difficult to exclude by CTA, but yesterday's MRA suggested at most moderate left siphon stenosis. 4. Minimal extracranial carotid atherosclerosis. 5. Mildly hyperplastic right vertebral artery without stenosis. This vessel primarily supplies the right ICA terminus. There is mild stenosis at the confluence of the right Pcomm and right P1 segment, which could jeopardize reconstitution of the right ICA terminus as described yesterday. 6. 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 7. Dominant left ACA A1 segment, the right A1 is diminutive or absent. 8. Small left MCA infarcts seen yesterday by MRI mostly remain occult by CT. No associated hemorrhage or mass effect. Electronically Signed: By: Odessa Fleming M.D. On: 01/04/2015 10:12   Ct Head Wo Contrast  01/03/2015  CLINICAL DATA:  Slurred speech and RIGHT hand numbness since 01/01/2015. EXAM: CT HEAD WITHOUT CONTRAST TECHNIQUE: Contiguous axial images were obtained from the base of the skull through the vertex without intravenous contrast. COMPARISON:  None. FINDINGS: No evidence for acute infarction, hemorrhage, mass lesion, hydrocephalus, or extra-axial fluid. No cortical atrophy or large vessel infarct. Slight prominence superior vermian cistern, could indicate early cerebellar atrophy. There may be mild chronic microvascular ischemic change. No asymmetric cortical or deep nuclei hypodensity. No CT signs of large vessel occlusion. Mild vascular calcification. No osseous findings. Negative sinuses and mastoids. No orbital abnormalities are evident. IMPRESSION: Negative exam.  No cause is seen of the described symptoms. Electronically Signed   By: Elsie Stain M.D.   On: 01/03/2015 10:02   Ct Angio Neck W/cm &/or Wo/cm  01/04/2015  ADDENDUM REPORT: 01/04/2015 10:47 ADDENDUM: Study discussed by  telephone with Dr. Beryle Beams on 01/04/2015 at 1034 hours. Electronically Signed   By: Odessa Fleming M.D.   On: 01/04/2015 10:47  01/04/2015  CLINICAL DATA:  54 year old female with scattered small acute infarcts in the left MCA territory diagnosed following 2 days duration of slurred speech and right arm weakness. Initial encounter. EXAM: CT ANGIOGRAPHY HEAD AND NECK TECHNIQUE: Multidetector CT imaging of the head and neck was performed using the standard protocol during bolus administration of intravenous contrast. Multiplanar CT image reconstructions and MIPs were obtained to evaluate the vascular anatomy. Carotid stenosis measurements (when applicable) are obtained utilizing NASCET criteria, using the distal internal carotid diameter as the denominator. CONTRAST:  75mL OMNIPAQUE IOHEXOL 350 MG/ML SOLN COMPARISON:  Brain MRI and intracranial MRA 01/03/2015. Head CT without contrast 01/03/2015. Carotid ultrasound 01/03/2015. FINDINGS: CT HEAD Brain: Subtle hypodensity in the posterior left frontal lobe corresponding to the larger of the small acute infarcts seen by MRI (series 2, image 23). Most of the lesions on MRI remain occult on CT. No associated hemorrhage or mass effect. Elsewhere stable and normal gray-white matter differentiation. No acute intracranial hemorrhage identified. No ventriculomegaly. Calvarium and skull base: Intact. Paranasal sinuses: Clear. Orbits: Visualized orbits and scalp soft tissues are within normal limits. CTA NECK Skeleton: Age congruent degenerative changes in the spine. Much of the dentition is absent. Mild maxillary sinus mucosal thickening. Other neck: Negative lung apices. No superior mediastinal lymphadenopathy. Thyromegaly, no discrete thyroid nodule. Larynx, pharynx, parapharyngeal spaces, retropharyngeal space, sublingual space, submandibular glands, and parotid  glands are within normal limits. No cervical lymphadenopathy. Aortic arch: 3 vessel arch configuration. No  significant arch atherosclerosis. No great vessel origin stenosis. Right carotid system: Normal right CCA origin. Mildly tortuous proximal right CCA. Mild soft and calcified plaque at the right carotid bifurcation. No right ICA origin or bulb stenosis. However, just distal to the bulb the cervical right ICA is very diminutive but remains patent to the right skullbase. See series 606, images 77-79. Left carotid system: Normal left CCA origin. Mild calcified and soft plaque at the left carotid bifurcation with no left ICA origin or bulb stenosis. Negative cervical left ICA otherwise. Vertebral arteries:No proximal right subclavian artery stenosis. Right vertebral artery appears mildly hyperplastic. Its origin is within normal limits ; mildly obscured by thoracic inlet streak artifact. Normal right vertebral artery to the skullbase. No proximal left subclavian artery stenosis. Normal left vertebral artery origin. Normal size left vertebral artery, normal to the skullbase. CTA HEAD Posterior circulation: Normal right PICA origin. High-grade stenosis left V4 segment at the foramen magnum related to soft and calcified plaque. The vessel remains patent. The left PICA origin is distal to this stenosis and patent. Normal distal right vertebral artery. Vertebrobasilar junction appears normal. No basilar artery stenosis. SCA origins are normal. Mild irregularity at both PCA origins. Stenosis appears mild by CTA. Right posterior communicating artery is patent. There is mild stenosis at the junction of the right Pcomm and P1 segment (series 602, image 226) bilateral PCA branches are patent, intermittent vessel irregularity mostly on the right side without definite stenosis. Anterior circulation: Dense, virtually complete calcification of the cavernous right ICA segment which appears to be the point of occlusion of that vessel. Reconstituted flow at the right ICA terminus and in the supraclinoid ICA distal to the right ophthalmic  artery origin which is not visible. Dense calcified plaque in the cavernous and supraclinoid ICA on the left side. Subsequently high-grade left cavernous segment stenosis is difficult to exclude, but mild or at most moderate stenosis evident here by MRA yesterday. Normal left ophthalmic artery origin. Patent carotid termini. The left A1 segment is dominant, the right is diminutive or absent. Anterior communicating artery is patent. Bilateral ACA branches are within normal limits. Right MCA origin and M1 segment are within normal limits. Right MCA branches are within normal limits. Left MCA origin is normal. Left M1 segment is within normal limits. At the left millicuries bifurcation there is high-grade stenosis at the origin of 1 of the 2 main M2 branches with preserved distal flow (series 602, image 228). The other M2 branch origin remains normal. There is preserved distal flow in both branches. No left MCA branch occlusion identified. Venous sinuses: Patent. Anatomic variants: Dominant left A1 segment, the right A1 is diminutive or absent. Delayed phase: No abnormal enhancement identified. IMPRESSION: 1. With regard to the acute left MCA infarcts: There is HIGH-GRADE STENOSIS at the origin of the more superior left MCA M2 branch, concordant with yesterday's MRA appearance. No left MCA branch occlusion identified. 2. Severe bilateral ICA siphon calcified plaque. CAVERNOUS RIGHT ICA OCCLUSION on the right. Subsequent diminutive cervical right ICA distal to the bulb. Reconstituted flow at the right ICA terminus via the right Pcomm. 3. Left ICA siphon high-grade stenosis is difficult to exclude by CTA, but yesterday's MRA suggested at most moderate left siphon stenosis. 4. Minimal extracranial carotid atherosclerosis. 5. Mildly hyperplastic right vertebral artery without stenosis. This vessel primarily supplies the right ICA terminus. There is mild stenosis at  the confluence of the right Pcomm and right P1 segment,  which could jeopardize reconstitution of the right ICA terminus as described yesterday. 6. 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 7. Dominant left ACA A1 segment, the right A1 is diminutive or absent. 8. Small left MCA infarcts seen yesterday by MRI mostly remain occult by CT. No associated hemorrhage or mass effect. Electronically Signed: By: Odessa Fleming M.D. On: 01/04/2015 10:12   Mr Maxine Glenn Head Wo Contrast  01/03/2015  CLINICAL DATA:  Slurred speech and right arm numbness, 1 day duration. EXAM: MRI HEAD WITHOUT CONTRAST MRA HEAD WITHOUT CONTRAST TECHNIQUE: Multiplanar, multiecho pulse sequences of the brain and surrounding structures were obtained without intravenous contrast. Angiographic images of the head were obtained using MRA technique without contrast. COMPARISON:  Head CT same day FINDINGS: MRI HEAD FINDINGS Diffusion imaging shows clustered sub cm foci of acute infarction affecting the left parietal cortical and subcortical brain consistent with left MCA branch vessel territory occlusion. No large confluent infarction. No other vascular territory involvement. No swelling or hemorrhage. The brainstem the cerebellum are normal. No abnormality of the right hemisphere. No mass lesion. No hydrocephalus or extra-axial collection. No pituitary mass. No inflammatory sinus disease. No skull or skullbase lesion. MRA HEAD FINDINGS The right internal carotid artery is a small vessel but does continue to show antegrade flow, suggesting a high-grade stenosis at the right carotid bifurcation. The right internal carotid artery in the supraclinoid region is reconstituted by a large posterior communicating artery. The vessel then goes on to supplied the right middle cerebral artery territory. The left internal carotid artery is patent through the skullbase. There is a 50% stenosis of the proximal siphon. This vessel supplies the left middle cerebral artery territory and both anterior cerebral artery  territories. There are missing MCA branch vessels in the parietal region consistent with a region of infarction. There is stenosis of 1 of the 2 visible MCA branch vessels. Both vertebral arteries are patent through the foramen magnum. There is 50% stenosis of the left vertebral artery at the foramen magnum. Posterior inferior cerebellar arteries show flow. No basilar stenosis. Superior cerebellar arteries and posterior cerebral arteries show flow. There is stenosis of the right P1 segment at the takeoff of the posterior communicating artery. IMPRESSION: Clustered sub cm foci of acute infarction affecting the left parietal cortical and subcortical brain consistent with left MCA branch vessel territory occlusion or emboli. 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 Supply to the right hemisphere drives primarily from flow through a patent posterior communicating artery on the right serving the MCA territory and through a patent anterior communicating artery from the left carotid. Missing MCA branch vessels on the left consistent with occlusion. One of the 2 visible main MCA branch vessels shows proximal stenosis. 50% stenosis of the left vertebral artery at the foramen magnum. Stenosis of the right P1 segment at the takeoff of the right posterior communicating artery. Therefore, the right anterior circulation is at risk from this stenosis as this vessel is the main supplier of the right MCA territory. Electronically Signed   By: Paulina Fusi M.D.   On: 01/03/2015 11:17   Mr Brain Wo Contrast  01/03/2015  CLINICAL DATA:  Slurred speech and right arm numbness, 1 day duration. EXAM: MRI HEAD WITHOUT CONTRAST MRA HEAD WITHOUT CONTRAST TECHNIQUE: Multiplanar, multiecho pulse sequences of the brain and surrounding structures were obtained without intravenous contrast.  Angiographic images of the head were obtained using MRA technique  without contrast. COMPARISON:  Head CT same day FINDINGS: MRI HEAD FINDINGS Diffusion imaging shows clustered sub cm foci of acute infarction affecting the left parietal cortical and subcortical brain consistent with left MCA branch vessel territory occlusion. No large confluent infarction. No other vascular territory involvement. No swelling or hemorrhage. The brainstem the cerebellum are normal. No abnormality of the right hemisphere. No mass lesion. No hydrocephalus or extra-axial collection. No pituitary mass. No inflammatory sinus disease. No skull or skullbase lesion. MRA HEAD FINDINGS The right internal carotid artery is a small vessel but does continue to show antegrade flow, suggesting a high-grade stenosis at the right carotid bifurcation. The right internal carotid artery in the supraclinoid region is reconstituted by a large posterior communicating artery. The vessel then goes on to supplied the right middle cerebral artery territory. The left internal carotid artery is patent through the skullbase. There is a 50% stenosis of the proximal siphon. This vessel supplies the left middle cerebral artery territory and both anterior cerebral artery territories. There are missing MCA branch vessels in the parietal region consistent with a region of infarction. There is stenosis of 1 of the 2 visible MCA branch vessels. Both vertebral arteries are patent through the foramen magnum. There is 50% stenosis of the left vertebral artery at the foramen magnum. Posterior inferior cerebellar arteries show flow. No basilar stenosis. Superior cerebellar arteries and posterior cerebral arteries show flow. There is stenosis of the right P1 segment at the takeoff of the posterior communicating artery. IMPRESSION: Clustered sub cm foci of acute infarction affecting the left parietal cortical and subcortical brain consistent with left MCA branch vessel territory occlusion or emboli. 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 Supply to the right hemisphere drives primarily from flow through a patent posterior communicating artery on the right serving the MCA territory and through a patent anterior communicating artery from the left carotid. Missing MCA branch vessels on the left consistent with occlusion. One of the 2 visible main MCA branch vessels shows proximal stenosis. 50% stenosis of the left vertebral artery at the foramen magnum. Stenosis of the right P1 segment at the takeoff of the right posterior communicating artery. Therefore, the right anterior circulation is at risk from this stenosis as this vessel is the main supplier of the right MCA territory. Electronically Signed   By: Paulina Fusi M.D.   On: 01/03/2015 11:17   US Carotid Bilateral  01/03/2015  CLINICAL DATA:  CVA.  Near occlusion of the right ICA on brain MRA. EXAM: BILATERAL CAROTID DUPLEX ULTRASOUND TECHNIQUE: Wallace Cullens scale imaging, color Doppler and duplex ultrasound were performed of bilateral carotid and vertebral arteries in the neck. COMPARISON:  MRI 01/03/2015 FINDINGS: Criteria: Quantification of carotid stenosis is based on velocity parameters that correlate the residual internal carotid diameter with NASCET-based stenosis levels, using the diameter of the distal internal carotid lumen as the denominator for stenosis measurement. The following velocity measurements were obtained: RIGHT ICA:  32 cm/sec CCA:  98 cm/sec SYSTOLIC ICA/CCA RATIO:  0.3 DIASTOLIC ICA/CCA RATIO:  0.5 ECA:  102 cm/sec LEFT ICA:  73 cm/sec CCA:  160 cm/sec SYSTOLIC ICA/CCA RATIO:  0.8 DIASTOLIC ICA/CCA RATIO:  1.2 ECA:  94 cm/sec RIGHT CAROTID ARTERY: Mild circumferential plaque in the mid right common carotid artery without significant stenosis. Right external carotid artery is patent with normal waveform and  velocities. Very low peak systolic velocities in the right internal carotid artery. Waveforms in  the right internal carotid artery are within normal limits. Peak systolic velocity in the mid right internal carotid artery is 25 cm/sec. There is mild plaque in the right internal carotid artery but no evidence for a critical stenosis in the visualized right internal carotid artery. RIGHT VERTEBRAL ARTERY: Antegrade flow and normal waveform in the right vertebral artery. LEFT CAROTID ARTERY: Mild plaque in the left common carotid artery without significant stenosis. Small amount of echogenic plaque at the left carotid bulb. Normal waveforms and velocities in the left external carotid artery. Echogenic plaque in the proximal internal carotid artery. Peak systolic velocities in the left internal carotid artery are on low side. The proximal left internal carotid artery peak systolic velocity is 47 cm/sec. Waveforms in the left internal carotid artery are grossly normal. LEFT VERTEBRAL ARTERY: Antegrade flow and normal waveform in the left vertebral artery. 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 Based on the peak systolic velocities, estimated degree of stenosis in the internal carotid arteries is less than 50%. However, peak systolic velocities in the internal carotid arteries are low on both sides, right side greater the left. Reason for these low velocities is uncertain. There does appear to be diminished flow to the right internal carotid artery based on the recent MRA examination. Recommend further evaluation of the great vessels and carotid arteries with a neck MRA examination. Patent vertebral arteries. Electronically Signed   By: Richarda Overlie M.D.   On: 01/03/2015 16:53     Labs: Basic Metabolic Panel:  Recent Labs Lab 01/03/15 0920 01/03/15 0925  NA 140 144  K 3.5 3.7  CL 105 104  CO2 28  --   GLUCOSE 116* 115*  BUN 12 11  CREATININE 0.83 0.80  CALCIUM 8.9  --    Liver Function Tests:  Recent Labs Lab 01/03/15 0920  AST 16  ALT 19  ALKPHOS 65    BILITOT 0.3  PROT 7.5  ALBUMIN 3.8   CBC:  Recent Labs Lab 01/03/15 0920 01/03/15 0925  WBC 4.8  --   NEUTROABS 2.2  --   HGB 11.5* 13.6  HCT 36.9 40.0  MCV 75.2*  --   PLT 371  --    CBG:  Recent Labs Lab 01/03/15 0910  GLUCAP 99    Principal Problem:   CVA (cerebral infarction) Active Problems:   Dyslipidemia   Time coordinating discharge: 35 minutes  Signed:  Brendia Sacks, MD [facility] 01/04/2015, 8:04 AM   By signing my name below, I, Burnett Harry attest that this documentation has been prepared under the direction and in the presence of Brendia Sacks, MD Electronically signed: Burnett Harry, Scribe.  01/04/2015  I personally performed the services described in this documentation. All medical record entries made by the scribe were at my direction. I have reviewed the chart and agree that the record reflects my personal performance and is accurate and complete. Brendia Sacks, MD

## 2015-01-04 NOTE — Progress Notes (Signed)
SLP Cancellation Note  Patient Details Name: Megan Holloway MRN: [id number] DOB: Feb 11, 1960   Cancelled treatment:       Reason Eval/Treat Not Completed: SLP screened, no needs identified, will sign off; Pt returned to baseline and denies any changes in thinking, memory, speaking, swallowing, or language. No formal evaluation completed. Pt will be receiving outpatient OT for her right hand.  Thank you,  Havery MorosDabney Jacobe Study, CCC-SLP [phone]    Randi Poullard 01/04/2015, 1:50 PM

## 2015-01-04 NOTE — Evaluation (Signed)
Occupational Therapy Evaluation Patient Details Name: Megan Holloway MRN: [id number] DOB: December 02, 1960 Today's Date: 01/04/2015    History of Present Illness The patient is a 53 year old right-handed black female who presents with acute numbness and the impaired dexterity on the right hand over the last 2 days. She also seemed to have difficulties with dysarthria/aphasia with the patient reporting difficulties with finding words. It appears that her speaking impairment has improved but she still is left with significant numbness and the hand dexterity impairment of the right. She did report having some dizziness. No headaches as reported. No shortness of breath, headaches or loss of consciousness. She reports that she's been relatively healthy. She has not seen a doctor a consistent basis however. She takes no medications. There is no history of nicotine use. The review of systems otherwise negative.   Clinical Impression   Pt awake, alert, and oriented this am, agreeable to OT evaluation. Pt c/o of weakness and limited use of right hand. Upon evaluation pt demonstrates RUE strength at 4-/5, decreased grip and pinch strength, decreased light touch sensation along median nerve of right hand. Pt reports she was unable to hold a cup with her right hand yesterday, however she can today with assist for grasping the cup with the left hand. Educated pt on AA/ROM and A/ROM exercises for right hand, as well as general A/ROM exercises for RUE. Recommend outpatient OT services on discharge to work on RUE strength, grip and pinch strengthening, fine motor coordination, and increasing functional use of RUE and hand as dominant.     Follow Up Recommendations  Outpatient OT    Equipment Recommendations  None recommended by OT       Precautions / Restrictions Precautions Precautions: None Restrictions Weight Bearing Restrictions: No      Mobility Bed Mobility Overal bed mobility: Independent                 Transfers Overall transfer level: Independent                         ADL Overall ADL's : Modified independent Eating/Feeding: Modified independent Eating/Feeding Details (indicate cue type and reason): Pt required to use left hand as assist due to weakness and decreased coordination of right hand                 Lower Body Dressing: Independent               Functional mobility during ADLs: Modified independent       Vision Vision Assessment?: No apparent visual deficits          Pertinent Vitals/Pain Pain Assessment: 0-10 Pain Score: 8  Pain Location: RUE Pain Descriptors / Indicators: Sore Pain Intervention(s): Limited activity within patient's tolerance;Monitored during session     Hand Dominance Right   Extremity/Trunk Assessment Upper Extremity Assessment Upper Extremity Assessment: RUE deficits/detail RUE Deficits / Details: RUE strength 4-/5, weak grip strength.  RUE Sensation: decreased light touch (along median nerve) RUE Coordination: decreased fine motor   Lower Extremity Assessment Lower Extremity Assessment: Defer to PT evaluation       Communication Communication Communication: No difficulties   Cognition Arousal/Alertness: Awake/alert Behavior During Therapy: WFL for tasks assessed/performed Overall Cognitive Status: Within Functional Limits for tasks assessed                                Home Living  Family/patient expects to be discharged to:: Private residence Living Arrangements: Spouse/significant other                           Home Equipment: None          Prior Functioning/Environment Level of Independence: Independent                OT Problem List: Decreased strength;Decreased range of motion;Decreased coordination;Impaired UE functional use    End of Session    Activity Tolerance: Patient tolerated treatment well Patient left:  (with transport for CT)    Time: [id number] OT Time Calculation (min): 20 min Charges:  OT General Charges $OT Visit: 1 Procedure OT Evaluation $Initial OT Evaluation Tier I: 1 Procedure  Ezra SitesLeslie Troxler, OTR/L  [phone]  01/04/2015, 9:13 AM

## 2015-01-04 NOTE — Progress Notes (Signed)
PT Cancellation Note  Patient Details Name: Georgiann Hahnattie Modica MRN: [id number] DOB: Apr 21, 1960   Cancelled Treatment:    Reason Eval/Treat Not Completed: PT screened, no needs identified, will sign off   Konrad PentaBrown, Tigerlily Christine L  PT 01/04/2015, 12:28 PM [phone]

## 2015-01-04 NOTE — Progress Notes (Signed)
PROGRESS NOTE  Megan Holloway RUE:[id number] DOB: [birth year] DOA: 01/03/2015 PCP: No PCP Per Patient  Summary: 54 y/o female with a hx of vertigo presents with weakness in her right hand, numbness in her right thumb, and slurred speech. While in the ED, labs were unremarkable. MRI/MRA revealed acute infarction affecting the left parietal cortical and subcortical brain consistent with left MCA branch vessel territory occlusion or emboli. She will be admitted for further workup and management.   Assessment/Plan: 1. 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 Not on anticoagulation or ASA as outpatient. Not a candidate for any intervention per Dr. Gerilyn Pilgrimoonquah. LDL 139. TSH wnl. Hgb A1C pending.Neurology was consulted and recommends dual antiplatelet agents for 6 months.   2. Multivessel intracranial occlusive disease. 3. Dyslipidemia, continue statin.     Overall improved. Follow up Hgb A1c.  Outpatient OT  Home 12/30  Code Status: Full DVT prophylaxis: Lovenox Family Communication: No family at bedside. Disposition Plan: Discharge home today.   Brendia Sacksaniel Goodrich, MD  [facility]  Pager [phone] If 7PM-7AM, please contact night-coverage at [email or web address], password [email or web address] 01/04/2015, 7:04 AM  LOS: 1 day   Consultants:  Neurology   OT- outpatient OT  Procedures:    Antibiotics:    HPI/Subjective: Feels okay. Right hand weakness is unchanged. Denies any difficulty speaking or swallowing. Eating okay.   Objective: Filed Vitals:   01/03/15 2230 01/04/15 0030 01/04/15 0230 01/04/15 0430  BP: 142/58 139/64 122/60 130/59  Pulse: 81 73 81 75  Temp: 98.6 F (37 C) 98.6 F (37 C) 98.9 F (37.2 C) 98.8 F (37.1 C)  TempSrc: Oral Oral Oral Oral  Resp: 20 20 18    Height:      Weight:      SpO2: 98% 95% 97% 98%     Intake/Output Summary (Last 24 hours) at 01/04/15 0704 Last data filed at 01/03/15 2125  Gross per 24 hour  Intake    240 ml  Output    300 ml  Net    -60 ml     Filed Weights   01/03/15 0856  Weight: 86.183 kg (190 lb)    Exam:     VSS, afebrile General:  Appears calm and comfortable Cardiovascular: RRR, no m/r/g. No LE edema. Telemetry: SR.  Respiratory: CTA bilaterally, no w/r/r. Normal respiratory effort. Musculoskeletal: grossly normal tone and strength in BLE. No change in RUE 4/5 arm and grip strength.  Psychiatric: grossly normal mood and affect, speech fluent and appropriate Neurologic: CN 2-12 intact, slight weakness in right shoulder strength.   New data reviewed:  LDL 139, cholesterol 213, triglycerides 150  TSH wnl  HEAD and NECK CTA noted.   Pertinent data since admission:  MRI/MRA revealed acute infarction affecting the left parietal cortical and subcortical brain consistent with left MCA branch vessel territory occlusion or emboli.  Pending data:  ECHO  Scheduled Meds: .  stroke: mapping our early stages of recovery book   Does not apply Once  . aspirin  300 mg Rectal Daily   Or  . aspirin  325 mg Oral Daily  . atorvastatin  40 mg Oral q1800  . enoxaparin (LOVENOX) injection  40 mg Subcutaneous Q24H   Continuous Infusions:   Principal Problem:   CVA (cerebral infarction)   By signing my name below, I, Burnett HarryJennifer Gregorio attest that this documentation has been prepared under the direction  and in the presence of Brendia Sacks, MD Electronically signed: Burnett Harry, Scribe. 01/04/2015  I personally performed the services described in this documentation. All medical record entries made by the scribe were at my direction. I have reviewed the chart and agree that the record reflects my personal performance and is accurate and complete. Brendia Sacks, MD

## 2015-01-04 NOTE — Care Management Note (Signed)
Case Management Note  Patient Details  Name: Megan Holloway MRN: [id number] Date of Birth: 05-19-1960  Subjective/Objective:                  Pt admitted from home with CVA. Pt lives alone and will return home at discharge. Pt is independent with ADL's.   Action/Plan: Awaiting SLP recommendations. OT recommends outpt OT and PT had no recommendations. Will fax outpt therapy form to [facility] outpt dept and they will call the pt with the appt times. Will give MATCH voucher for medication assistance. Pt also given information for the [place] [facility] program. Financial counselor will contact pt about self pay status and hospital bill.  Expected Discharge Date:                  Expected Discharge Plan:  Home/Self Care  In-House Referral:  Financial Counselor  Discharge planning Services  CM Consult  Post Acute Care Choice:  NA Choice offered to:  NA  DME Arranged:    DME Agency:     HH Arranged:    HH Agency:     Status of Service:  Completed, signed off  Medicare Important Message Given:    Date Medicare IM Given:    Medicare IM give by:    Date Additional Medicare IM Given:    Additional Medicare Important Message give by:     If discussed at Long Length of Stay Meetings, dates discussed:    Additional Comments:  Cheryl FlashBlackwell, Marquesha Robideau Crowder, RN 01/04/2015, 1:05 PM

## 2015-01-05 ENCOUNTER — Encounter (HOSPITAL_COMMUNITY): Payer: Self-pay | Admitting: Family Medicine

## 2015-01-05 DIAGNOSIS — E785 Hyperlipidemia, unspecified: Secondary | ICD-10-CM

## 2015-01-05 HISTORY — DX: Hyperlipidemia, unspecified: E78.5

## 2015-01-05 LAB — HEMOGLOBIN A1C
Hgb A1c MFr Bld: 6.8 % — ABNORMAL HIGH (ref 4.8–5.6)
MEAN PLASMA GLUCOSE: 148 mg/dL

## 2015-01-05 LAB — HOMOCYSTEINE: HOMOCYSTEINE-NORM: 10.6 umol/L (ref 0.0–15.0)

## 2015-01-05 MED ORDER — FLUCONAZOLE 100 MG PO TABS
150.0000 mg | ORAL_TABLET | Freq: Once | ORAL | Status: AC
  Administered 2015-01-05: 150 mg via ORAL
  Filled 2015-01-05: qty 2

## 2015-01-05 MED ORDER — ASPIRIN 325 MG PO TABS: 325.0000 mg | ORAL_TABLET | Freq: Every day | ORAL | Status: AC

## 2015-01-05 MED ORDER — CLOPIDOGREL BISULFATE 75 MG PO TABS: 75.0000 mg | ORAL_TABLET | Freq: Every day | ORAL | Status: AC

## 2015-01-05 MED ORDER — ATORVASTATIN CALCIUM 80 MG PO TABS
80.0000 mg | ORAL_TABLET | Freq: Every day | ORAL | Status: AC
Start: 2015-01-05 — End: ?

## 2015-01-05 MED ORDER — MONTELUKAST SODIUM 10 MG PO TABS: 10.0000 mg | ORAL_TABLET | ORAL | Status: AC

## 2015-01-05 NOTE — Progress Notes (Signed)
Patient ID: Tiwanna Tuch, female   DOB: 02-Nov-1960, 54 y.o.   MRN: [id number]  Lawrence A. Merlene Laughter, MD     [email or web address]          Kaysi Ourada is an 54 y.o. female.   Assessment/Plan:  1. 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 Given the patient's anatomy thromboembolism is more likely. 2. Asymptomatic critical intracranial right ICA stenosis. 3. Multivessel intracranial occlusive disease.     RECOMMENDATION: The patient should be managed vigorously with medical treatment as opposed interventional treatment. The patient should be placed on dual antiplatelet agents for 6 months. Subsequently, the patient should be placed in a single agent preferably aspirin 325. Agree with lipid panel and hemoglobin A1c. The patient should be placed on a statin however. CTA of head - neck Follow-up echocardiography. Additional labs P: RPR, HIV, ESR, CRP,TSH, homocysteine and Vit B12   Doing about the same. Results of tests discussed with pt and family.   GENERAL: Pleasant female in no acute distress.  HEENT: Supple. Atraumatic normocephalic.   ABDOMEN: soft  EXTREMITIES: No edema   BACK: Normal.  SKIN: Normal by inspection.   MENTAL STATUS: Alert and oriented. She states her age and the month appropriately. Speech, language and cognition are generally intact. Judgment and insight normal. No language impairment with good naming and repetition.  CRANIAL NERVES: Pupils are equal, round and reactive to light and accommodation; extra ocular movements are full, there is no significant nystagmus; visual fields are full; upper and lower facial muscles are normal in strength and symmetric, there is no flattening of the nasolabial folds; tongue is midline; uvula is midline; shoulder elevation is normal.  MOTOR: Normal tone, bulk and strength; no pronator drift. There is no drift of the legs. There is mild  impairment of hand extra changes involving the right hand.  COORDINATION: Left finger to nose is normal, right finger to nose is normal, No rest tremor; no intention tremor; no postural tremor; no bradykinesia.  REFLEXES: Deep tendon reflexes are symmetrical and normal. Babinski reflexes are flexor bilaterally.   SENSATION: Normal to light touch. There is no tactile or visual extinction on double simultaneous stimulation.          Objective: Vital signs in last 24 hours: Temp:  [97.9 F (36.6 C)-98.7 F (37.1 C)] 97.9 F (36.6 C) (12/30 0830) Pulse Rate:  [59-80] 59 (12/30 0830) Resp:  [15-18] 18 (12/30 0830) BP: (102-148)/(65-85) 143/85 mmHg (12/30 0830) SpO2:  [97 %-100 %] 100 % (12/30 0830)  Intake/Output from previous day: 12/29 0701 - 12/30 0700 In: 720 [P.O.:720] Out: 500 [Urine:500] Intake/Output this shift: Total I/O In: 222 [P.O.:222] Out: -  Nutritional status: Diet Heart Room service appropriate?: Yes; Fluid consistency:: Thin   Lab Results: Results for orders placed or performed during the hospital encounter of 01/03/15 (from the past 48 hour(s))  Lipid panel     Status: Abnormal   Collection Time: 01/04/15  6:41 AM  Result Value Ref Range   Cholesterol 213 (H) 0 - 200 mg/dL   Triglycerides 150 (H) <150 mg/dL   HDL 44 >40 mg/dL   Total CHOL/HDL Ratio 4.8 RATIO   VLDL 30 0 - 40 mg/dL   LDL Cholesterol 139 (H) 0 - 99 mg/dL    Comment:        Total Cholesterol/HDL:CHD Risk Coronary Heart Disease Risk Table  Men   Women  1/2 Average Risk   3.4   3.3  Average Risk       5.0   4.4  2 X Average Risk   9.6   7.1  3 X Average Risk  23.4   11.0        Use the calculated Patient Ratio above and the CHD Risk Table to determine the patient's CHD Risk.        ATP III CLASSIFICATION (LDL):  <100     mg/dL   Optimal  100-129  mg/dL   Near or Above                    Optimal  130-159  mg/dL   Borderline  160-189  mg/dL   High  >190      mg/dL   Very High   TSH     Status: None   Collection Time: 01/04/15  6:41 AM  Result Value Ref Range   TSH 1.938 0.350 - 4.500 uIU/mL  Vitamin B12     Status: None   Collection Time: 01/04/15  6:41 AM  Result Value Ref Range   Vitamin B-12 344 180 - 914 pg/mL    Comment: (NOTE) This assay is not validated for testing neonatal or myeloproliferative syndrome specimens for Vitamin B12 levels. Performed at [facility]   Sedimentation rate     Status: None   Collection Time: 01/04/15  6:41 AM  Result Value Ref Range   Sed Rate 10 0 - 22 mm/hr  C-reactive protein     Status: Abnormal   Collection Time: 01/04/15  6:41 AM  Result Value Ref Range   CRP 2.2 (H) <1.0 mg/dL    Comment: Performed at [facility]  01/04/15 0641  CHOL 213*  TRIG 150*  HDL 44  CHOLHDL 4.8  VLDL 30  LDLCALC 139*    Studies/Results:   CTA results reviewed  Medications:  Scheduled Meds: .  stroke: mapping our early stages of recovery book   Does not apply Once  . aspirin  325 mg Oral Daily  . atorvastatin  80 mg Oral q1800  . clopidogrel  75 mg Oral Daily  . enoxaparin (LOVENOX) injection  40 mg Subcutaneous Q24H   Continuous Infusions:  PRN Meds:.acetaminophen     LOS: 2 days   Gianni Mihalik A. Merlene Laughter, M.D.  Diplomate, Tax adviser of Psychiatry and Neurology ( Neurology).

## 2015-01-05 NOTE — Progress Notes (Signed)
PROGRESS NOTE  Megan Holloway WGN:[id number] DOB: Feb 24, 1960 DOA: 01/03/2015 PCP: Lianne BushyMary Blessing  Summary: [age] y/o female with a hx of vertigo presents with weakness in her right hand, numbness in her right thumb, and slurred speech. While in the ED, labs were unremarkable. MRI/MRA revealed acute infarction affecting the left parietal cortical and subcortical brain consistent with left MCA branch vessel territory occlusion or emboli. She will be admitted for further workup and management.   Assessment/Plan: 1. 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 Not on anticoagulation or ASA as outpatient. Not a candidate for any intervention per Dr. Gerilyn Pilgrimoonquah. LDL 139. TSH wnl. Hgb A1C 6.8.Echo as below. Neurology recommended dual antiplatelet agents for 6 months.   2. Multivessel intracranial occlusive disease. 3. Dyslipidemia, continue statin. 4. Candidal vulvovaginitis, given one dose of Diflucan as inpatient.   Overall improved. Neurologically stable. Per Dr. Gerilyn Pilgrim[place]quah home today on dual antiplatelet agents, statin  Patient will f/u with PCP and prefers that PCP arrange outpatient neurology follow-up. Recommend in 1-2 months.  Outpatient OT  Code Status: Full DVT prophylaxis: Lovenox Family Communication: No family at bedside. Disposition Plan: Discharge home today.   Brendia Sacksaniel Jene Huq, MD  [facility]  Pager [phone] If 7PM-7AM, please contact night-coverage at [email or web address], password [email or web address] 01/05/2015, 7:17 AM  LOS: 2 days   Consultants:  Neurology   OT- outpatient OT  Procedures:  ECHO Study Conclusions  - Left ventricle: The cavity size was normal. Wall thickness was normal. Systolic function was normal. The estimated ejection fraction was in the range of 60% to 65%. Wall motion was normal; there were no regional  wall motion abnormalities. Diastolic dysfunction, grade indeterminate, with indeterminate filling pressures. - Mitral valve: Mildly calcified annulus. Normal thickness leaflets . There was mild regurgitation. - Left atrium: The atrium was mildly dilated. - Atrial septum: No defect or patent foramen ovale was identified. - Tricuspid valve: There was mild regurgitation. - Pulmonary arteries: Systolic pressure was mildly increased. PA peak pressure: 33 mm Hg (S).  Antibiotics:  none  HPI/Subjective: Feels okay. Still has some right hand numbness and weakness.   Objective: Filed Vitals:   01/04/15 1630 01/04/15 2030 01/05/15 0030 01/05/15 0430  BP: 141/73 148/69 146/70 102/65  Pulse: 80 65 69 60  Temp: 98.4 F (36.9 C) 98.7 F (37.1 C) 98.2 F (36.8 C) 98.5 F (36.9 C)  TempSrc: Oral Oral Oral Oral  Resp: 18 15 15 16   Height:      Weight:      SpO2: 98% 98% 97% 100%    Intake/Output Summary (Last 24 hours) at 01/05/15 0717 Last data filed at 01/04/15 1800  Gross per 24 hour  Intake    720 ml  Output    500 ml  Net    220 ml     Filed Weights   01/03/15 0856  Weight: 86.183 kg (190 lb)    Exam:     VSS, afebrile General:  Appears comfortable, calm. Cardiovascular: Regular rate and rhythm, no murmur, rub or gallop. No lower extremity edema. Telemetry: Sinus rhythm, no arrhythmias  Respiratory: Clear to auscultation bilaterally, no wheezes, rales or rhonchi. Normal respiratory effort. Abdomen: soft, ntnd Musculoskeletal: no change in strength RUE, hand 4/5. Exam otherwise nonfocal. Psychiatric: grossly normal mood and affect, speech fluent and appropriate Neurologic: CN 2-12 intact, no pronator drift, no pass pointing    New data  reviewed:  No new data  Pertinent data since admission:  MRI/MRA revealed acute infarction affecting the left parietal cortical and subcortical brain consistent with left MCA branch vessel territory occlusion or  emboli.  LDL 139, cholesterol 213, triglycerides 150  TSH wnl  HEAD and NECK CTA noted.   Pending data:  ECHO  Scheduled Meds: .  stroke: mapping our early stages of recovery book   Does not apply Once  . aspirin  325 mg Oral Daily  . atorvastatin  80 mg Oral q1800  . clopidogrel  75 mg Oral Daily  . enoxaparin (LOVENOX) injection  40 mg Subcutaneous Q24H   Continuous Infusions:   Principal Problem:   CVA (cerebral infarction)   By signing my name below, I, Burnett Harry attest that this documentation has been prepared under the direction and in the presence of Brendia Sacks, MD Electronically signed: Burnett Harry, Scribe. 01/05/2015   I personally performed the services described in this documentation. All medical record entries made by the scribe were at my direction. I have reviewed the chart and agree that the record reflects my personal performance and is accurate and complete. Brendia Sacks, MD

## 2015-01-05 NOTE — Progress Notes (Signed)
Discharged PT per MD order and protocol. Reviewed discharge teaching and handouts given. Pt verbalized understanding and left with all belongings. Prescriptions were given. Patient diflucan that was ordered was given to patient prior to discharge. [facility]. Information with medication payment and OT was  Given along with daughter doctor's note for work. VSS. IV catheter D/C.  Patient wheeled down by staff member. Lesly Dukesachel J Everett, RN

## 2015-01-06 LAB — HIV ANTIBODY (ROUTINE TESTING W REFLEX): HIV SCREEN 4TH GENERATION: NONREACTIVE

## 2015-01-10 LAB — RPR: RPR: NONREACTIVE

## 2015-01-12 ENCOUNTER — Ambulatory Visit (HOSPITAL_COMMUNITY): Payer: MEDICAID | Admitting: Occupational Therapy

## 2015-01-17 ENCOUNTER — Ambulatory Visit (HOSPITAL_COMMUNITY): Payer: MEDICAID | Admitting: Occupational Therapy

## 2015-01-23 ENCOUNTER — Ambulatory Visit (HOSPITAL_COMMUNITY): Payer: MEDICAID | Admitting: Occupational Therapy

## 2015-01-26 ENCOUNTER — Encounter (HOSPITAL_COMMUNITY): Payer: Self-pay

## 2015-01-26 ENCOUNTER — Ambulatory Visit (HOSPITAL_COMMUNITY): Payer: Medicaid Other | Attending: Family Medicine

## 2015-01-26 DIAGNOSIS — I63412 Cerebral infarction due to embolism of left middle cerebral artery: Secondary | ICD-10-CM | POA: Insufficient documentation

## 2015-01-26 DIAGNOSIS — Z789 Other specified health status: Secondary | ICD-10-CM | POA: Diagnosis present

## 2015-01-26 DIAGNOSIS — M6289 Other specified disorders of muscle: Secondary | ICD-10-CM | POA: Insufficient documentation

## 2015-01-26 DIAGNOSIS — R531 Weakness: Secondary | ICD-10-CM

## 2015-01-26 DIAGNOSIS — R29898 Other symptoms and signs involving the musculoskeletal system: Secondary | ICD-10-CM

## 2015-01-26 DIAGNOSIS — M25611 Stiffness of right shoulder, not elsewhere classified: Secondary | ICD-10-CM | POA: Insufficient documentation

## 2015-01-26 DIAGNOSIS — R279 Unspecified lack of coordination: Secondary | ICD-10-CM | POA: Insufficient documentation

## 2015-01-26 DIAGNOSIS — M6281 Muscle weakness (generalized): Secondary | ICD-10-CM

## 2015-01-26 NOTE — Patient Instructions (Signed)
Home Exercises Program Theraputty Exercises  Do the following exercises 3-5 times a day using your affected hand.  1. Roll putty into a ball.  2. Make into a pancake.  3. Roll putty into a roll.  4. Pinch along log with first finger and thumb.   5. Make into a ball.  6. Roll it back into a log.   7. Pinch using thumb and side of first finger.  8. Roll into a ball, then flatten into a pancake.  9. Using your fingers, make putty into a mountain.   Fine Motor Coordination Exercises  Perform the following exercises 3-5 times a day, as recommended by your occupational therapist.   Close all fingers and thumb into a tight fist and then open wide. (10 times)  Palm of hand on table, spread fingers apart, then together. (10 times)  Lift fingers and thumb off table one at a time. Increase speed as able. (10 times)  Touch thumb to each fingertip. Increase speed as able. (10 times)  Thumb circles. (10 times)  Pick up 5 small objects (coins, marbles, paperclips, beads, etc.) one at a time and hold them in hand, then place them one by one onto the table.  Pick up small objects and place them into a cup or container.  Place clothespins onto the edge of a cup, can, or container.  Play card games. Practice shuffling and dealing cards. Flip cards over onto table one by one.  Stack approximately [employer] (checkers, coins, etc.) onto table.  Practice writing skills, dot to dot, puzzles, etc.  With tweezers, pick up small objects and put into a small container. Try sorting beads or buttons.

## 2015-01-26 NOTE — Therapy (Signed)
[facility] [address] [place], [place], [address] Phone: [phone]   Fax:  [phone]  Occupational Therapy Evaluation  Patient Details  Name: Megan Holloway MRN: [id number] Date of Birth: 03-28-1960 Referring Provider: Irene Limbo  Encounter Date: 01/26/2015      OT End of Session - 01/26/15 1424    Visit Number 1   Number of Visits 12   Date for OT Re-Evaluation 03/27/15   Authorization Type Self Pay. Patient is working on Health visitor and Disability. Informed patient of approximately how much each session would be.   OT Start Time 1300   OT Stop Time 1345   OT Time Calculation (min) 45 min   Activity Tolerance Patient tolerated treatment well   Behavior During Therapy WFL for tasks assessed/performed      Past Medical History  Diagnosis Date  . Vertigo   . Dyslipidemia 01/05/2015    Past Surgical History  Procedure Laterality Date  . Uterine fibroid surgery    . Cesarean section      There were no vitals filed for this visit.  Visit Diagnosis:  Cerebrovascular accident (CVA) due to embolism of left middle cerebral artery (HCC) - Plan: Ot plan of care cert/re-cert  Right sided weakness - Plan: Ot plan of care cert/re-cert  Lack of coordination - Plan: Ot plan of care cert/re-cert  Decreased grip strength of right hand - Plan: Ot plan of care cert/re-cert  Muscle weakness of right upper extremity - Plan: Ot plan of care cert/re-cert  Decreased activities of daily living (ADL) - Plan: Ot plan of care cert/re-cert      Subjective Assessment - 01/26/15 1418    Subjective  S: My hand is numb. it comes and goes. I can't hold onto anything.    Pertinent History Patient is a 55 y/o female S/P Left posterior MCA infarct on 01/03/15 causing increased numbness, pain, decreased strength and coordination in right hand. At this time patient is not working but is planning on looking for a job as soon as she can. Dr.  Irene Limbo has referred patient to occupational therapy for evaluation and treatment.    Special Tests Will complete DASH at next session.    Patient Stated Goals To return to work and be able to do more for myself.    Currently in Pain? Yes   Pain Score 8    Pain Location Arm   Pain Orientation Right   Pain Descriptors / Indicators Sore   Pain Type Acute pain           [facility] OT Assessment - 01/26/15 1307    Assessment   Diagnosis Right side weakness   Referring Provider Irene Limbo   Onset Date 01/03/16   Prior Therapy Acute Care OT, Speech, PT   Precautions   Precautions Other (comment)   Precaution Comments Decreased sensation in right Hand   Restrictions   Weight Bearing Restrictions No   Balance Screen   Has the patient fallen in the past 6 months No   Home  Environment   Family/patient expects to be discharged to: Private residence   Living Arrangements Alone   Prior Function   Level of Independence Independent   ADL   ADL comments Difficulty with combing hair, doing buttons and zippers, putting on socks. Unable to brush teeth with right hand.   Unable to work right now due to deficits. 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    Mobility   Mobility Status Independent   Written Expression   Dominant Hand Right   Vision - History   Baseline Vision Wears glasses all the time   Cognition   Overall Cognitive Status Within Functional Limits for tasks assessed   Sensation   Light Touch Impaired Detail   Light Touch Impaired Details Impaired RUE   Semmes Weinstein Monofilament Scale Unresponsive  Only feeling is in 5th MCP from finger tip to wrist   Stereognosis Impaired by gross assessment   Hot/Cold Impaired by gross assessment   Proprioception Appears Intact   Coordination   Gross Motor Movements are Fluid and Coordinated No   Fine Motor Movements are Fluid and Coordinated No   9 Hole Peg Test  Right;Left   Right 9 Hole Peg Test 1'17"   Left 9 Hole Peg Test 25"   ROM / Strength   AROM / PROM / Strength Strength   Strength   Overall Strength Comments Assessed seated.   Strength Assessment Site Shoulder;Hand   Right/Left Shoulder Right   Right Shoulder Flexion 3/5   Right Shoulder ABduction 3/5   Right Shoulder Internal Rotation 3/5   Right Shoulder External Rotation 3/5   Right Shoulder Horizontal ABduction 3/5   Right Shoulder Horizontal ADduction 3/5   Right/Left hand Right;Left   Right Hand Grip (lbs) 20   Right Hand Lateral Pinch 2 lbs   Right Hand 3 Point Pinch 4 lbs   Left Hand Grip (lbs) 50   Left Hand Lateral Pinch 10 lbs   Left Hand 3 Point Pinch 18 lbs                         OT Education - 01/26/15 1424    Education provided Yes   Education Details yellow theraputty and fine motor coordination activities   Person(s) Educated Patient   Methods Explanation;Verbal cues;Handout;Demonstration   Comprehension Verbalized understanding          OT Short Term Goals - 01/26/15 1429    OT SHORT TERM GOAL #1   Title Patient will be educated and independent with HEP allowing for right UE to return as dominant extremity.    Time 3   Period Weeks   Status New   OT SHORT TERM GOAL #2   Title Patient will increase coordination in Right hand by decreasing 9 hole peg test time by 20 seconds.    Time 3   Period Weeks   Status New   OT SHORT TERM GOAL #3   Title Patient will increase grip strength by 10# and pinch strength by 5# to increase ability to hold onto items without dropping them.    Time 3   Period Weeks   Status New   OT SHORT TERM GOAL #4   Title Patient will increase RUE strength to 4/5 to increase ability to return to normal work activities.    Time 3   Period Weeks   Status New   OT SHORT TERM GOAL #5   Title Patient will report a decrease in pain level in RUE to 4/10 or less during daily tasks.    Time 3   Period Weeks    Status New           OT Long Term Goals - 01/26/15 1432    OT LONG TERM GOAL #1   Title Patient will return to highest level  of independence with all daily tasks using RUE as dominant extremity.    Time 6   Period Weeks   Status New   OT LONG TERM GOAL #2   Title Patient will increase coordination by decreasing 9 hole peg test time by 20 seconds allowing for an increased ability to do buttons and zippers with RUE.    Time 6   Period Weeks   Status New   OT LONG TERM GOAL #3   Title Patient will increase RUE strength to 4+/5 to increase ability to complete work related tasks using RUE.   Time 6   Period Weeks   Status New   OT LONG TERM GOAL #4   Title Patient will decrease pain level in RUE to 2/10 or less when completing daily tasks.    Time 6   Period Weeks   Status New   OT LONG TERM GOAL #5   Title Patient will increase grip strength by 15# and pinch strength by 10# to increase ability to hold on items and comb hair with more independence.    Time 6   Period Weeks   Status New               Plan - 01/26/15 1425    Clinical Impression Statement A: Patient is a 55 y/o female S/P left CVA causing increase pain and decreased strength, coordination, and sensation resulting in difficulty completing ADL tasks using Right hand as dominant extremity.    Pt will benefit from skilled therapeutic intervention in order to improve on the following deficits (Retired) Decreased strength;Impaired sensation;Pain;Impaired UE functional use;Decreased coordination   Rehab Potential Excellent   OT Frequency 2x / week   OT Duration 6 weeks   OT Treatment/Interventions Self-care/ADL training;Ultrasound;Patient/family education;Cryotherapy;Electrical Stimulation;Moist Heat;Neuromuscular education;Therapeutic activities;Therapeutic exercises;Splinting   Plan P: Pt requires skilled OT services to increase functional performance during ADL tasks using RUE as dominant. Treatment Plan:  education given regarding sensation lost. Grip and pinch strengthening, UB strengthening. coordinaation tasks.    Consulted and Agree with Plan of Care Patient        Problem List Patient Active Problem List   Diagnosis Date Noted  . Dyslipidemia 01/05/2015  . CVA (cerebral infarction) 01/03/2015    Limmie Patricia, OTR/L,CBIS  [phone]  01/26/2015, 2:40 PM   [facility] [address] [place], [place], [address] Phone: [phone]   Fax:  [phone]  Name: Megan Holloway MRN: [id number] Date of Birth: [birth year]

## 2015-01-29 ENCOUNTER — Ambulatory Visit (HOSPITAL_COMMUNITY): Payer: Medicaid Other | Admitting: Specialist

## 2015-01-29 ENCOUNTER — Telehealth (HOSPITAL_COMMUNITY): Payer: Self-pay

## 2015-01-29 NOTE — Telephone Encounter (Signed)
DATE: 01/29/15  Called patient regarding missed appt today at 1:45pm. Patient reports that [facility] did not come to get her. Rescheduled patient for 2:30 today. Pt is going to call [facility] and see if they're still coming to get her or not. She will call and let us know.   Limmie Patricia, OTR/L,CBIS  [phone]

## 2015-01-31 ENCOUNTER — Encounter (HOSPITAL_COMMUNITY): Payer: Self-pay | Admitting: Occupational Therapy

## 2015-02-02 ENCOUNTER — Ambulatory Visit (HOSPITAL_COMMUNITY): Payer: Medicaid Other

## 2015-02-02 ENCOUNTER — Encounter (HOSPITAL_COMMUNITY): Payer: Self-pay

## 2015-02-02 DIAGNOSIS — R29898 Other symptoms and signs involving the musculoskeletal system: Secondary | ICD-10-CM

## 2015-02-02 DIAGNOSIS — M25611 Stiffness of right shoulder, not elsewhere classified: Secondary | ICD-10-CM

## 2015-02-02 DIAGNOSIS — R531 Weakness: Secondary | ICD-10-CM

## 2015-02-02 DIAGNOSIS — I63412 Cerebral infarction due to embolism of left middle cerebral artery: Secondary | ICD-10-CM | POA: Diagnosis not present

## 2015-02-02 NOTE — Therapy (Signed)
[place] [place] [facility] [address] [place], [place], [address] Phone: [phone]   Fax:  [phone]  Occupational Therapy Treatment  Patient Details  Name: Megan Holloway MRN: [id number] Date of Birth: December 02, 1960 Referring Provider: Irene Limbo  Encounter Date: 02/02/2015      OT End of Session - 02/02/15 1514    Visit Number 2   Number of Visits 12   Date for OT Re-Evaluation 03/27/15   Authorization Type Self Pay. Patient is working on Health visitor and Disability. Informed patient of approximately how much each session would be.   OT Start Time 1300  ROM assessed as well as RUE pain   OT Stop Time 1345   OT Time Calculation (min) 45 min   Activity Tolerance Patient tolerated treatment well   Behavior During Therapy WFL for tasks assessed/performed      Past Medical History  Diagnosis Date  . Vertigo   . Dyslipidemia 01/05/2015    Past Surgical History  Procedure Laterality Date  . Uterine fibroid surgery    . Cesarean section      There were no vitals filed for this visit.  Visit Diagnosis:  Right sided weakness  Decreased grip strength of right hand  Stiffness of shoulder joint, right      Subjective Assessment - 02/02/15 1510    Subjective  S: My entire arm hurts to the touch. My clothes don't bother it. Just when someone touches it.    Currently in Pain? Yes   Pain Score 8    Pain Location Arm   Pain Orientation Right   Pain Descriptors / Indicators Constant;Tender;Grimacing;Discomfort;Aching   Pain Type Acute pain            [facility] OT Assessment - 02/02/15 0001    Assessment   Diagnosis Right side weakness   Precautions   Precautions Other (comment)   Precaution Comments Decreased sensation in right Hand   ROM / Strength   AROM / PROM / Strength AROM;PROM;Strength   AROM   Overall AROM Comments Assessed seated. IR/er Adducted   AROM Assessment Site Shoulder   Right/Left Shoulder Right   Right  Shoulder Flexion 108 Degrees   Right Shoulder ABduction 98 Degrees   Right Shoulder Internal Rotation 90 Degrees   Right Shoulder External Rotation 48 Degrees   PROM   Overall PROM Comments Patient would not allow me to passively range arm.                   OT Treatments/Exercises (OP) - 02/02/15 1311    Exercises   Exercises Hand   Hand Exercises   Hand Gripper with Large Beads 6/6 with gripper set at 7#   Hand Gripper with Medium Beads All beads with gripper set at 7#   Sponges 12,9   Other Hand Exercises Towel crumpling 5X with right hand                OT Education - 02/02/15 1514    Education provided Yes   Education Details table slides and OT evaluation. Goals reviewed.    Person(s) Educated Patient   Methods Explanation;Demonstration;Verbal cues;Handout   Comprehension Returned demonstration;Verbalized understanding          OT Short Term Goals - 02/02/15 1521    OT SHORT TERM GOAL #1   Title Patient will be educated and independent with HEP allowing for right UE to return as dominant extremity.    Time 3   Period Weeks  Status On-going   OT SHORT TERM GOAL #2   Title Patient will increase coordination in Right hand by decreasing 9 hole peg test time by 20 seconds.    Time 3   Period Weeks   Status On-going   OT SHORT TERM GOAL #3   Title Patient will increase grip strength by 10# and pinch strength by 5# to increase ability to hold onto items without dropping them.    Time 3   Period Weeks   Status On-going   OT SHORT TERM GOAL #4   Title Patient will increase RUE strength to 4/5 to increase ability to return to normal work activities.    Time 3   Period Weeks   Status On-going   OT SHORT TERM GOAL #5   Title Patient will report a decrease in pain level in RUE to 4/10 or less during daily tasks.    Time 3   Period Weeks   Status On-going   Additional Short Term Goals   Additional Short Term Goals Yes   OT SHORT TERM GOAL #6    Title Patient will increase P/ROM of RUE to [facility] to increase ability to use arm for functional tasks.    Time 3   Period Weeks   Status New           OT Long Term Goals - 02/02/15 1521    OT LONG TERM GOAL #1   Title Patient will return to highest level of independence with all daily tasks using RUE as dominant extremity.    Time 6   Period Weeks   Status On-going   OT LONG TERM GOAL #2   Title Patient will increase coordination by decreasing 9 hole peg test time by 20 seconds allowing for an increased ability to do buttons and zippers with RUE.    Time 6   Period Weeks   Status On-going   OT LONG TERM GOAL #3   Title Patient will increase RUE strength to 4+/5 to increase ability to complete work related tasks using RUE.   Time 6   Period Weeks   Status On-going   OT LONG TERM GOAL #4   Title Patient will decrease pain level in RUE to 2/10 or less when completing daily tasks.    Time 6   Period Weeks   Status On-going   OT LONG TERM GOAL #5   Title Patient will increase grip strength by 15# and pinch strength by 10# to increase ability to hold on items and comb hair with more independence.    Time 6   Period Weeks   Status On-going   Long Term Additional Goals   Additional Long Term Goals Yes   OT LONG TERM GOAL #6   Title Patient will increase A/ROM of RUE to [facility] to increase ability to complete tasks at shoulder height.               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 Therapist asked if there was something else she was interested in doing such as being a companion. Patient agreed that she would like this although there was nothing in [place]. Therapist showed patient [email or web address] and informed her that there are at least 7 opening for a compantion. Pt didn't seem to really understand how to apply or that it was private jobs and  not a company. Pt was very  figidity during session and seemed very anxious. She did not allow me to passively range her arm. Expressed the need to take the pain medication that was given by her as that is what will allow her to use her arm.  During gripper task, patient used a finger tip pinch versus a grip due to wrist pain with hard gripping.    Plan P: follow up on new HEP. Provide patient with information on post stroke pain. Complete pinch activity.        Problem List Patient Active Problem List   Diagnosis Date Noted  . Dyslipidemia 01/05/2015  . CVA (cerebral infarction) 01/03/2015    Limmie Patricia, OTR/L,CBIS  [phone]  02/02/2015, 3:24 PM  [facility] [address] [place], [place], [address] Phone: [phone]   Fax:  [phone]  Name: Landry Lookingbill MRN: [id number] Date of Birth: [birth year]

## 2015-02-02 NOTE — Patient Instructions (Signed)
Completed each table slide for 1-3 minutes. 3-5 times per day.  SHOULDER: Flexion On Table   Place hands on table, elbows straight. Move hips away from body. Press hands down into table.   Abduction (Passive)   With arm out to side, resting on table, lower head toward arm, keeping trunk away from table.   Copyright  VHI. All rights reserved.     Internal Rotation (Assistive)   Seated with elbow bent at right angle and held against side, slide arm on table surface in an inward arc.  Activity: Use this motion to brush crumbs off the table.  Copyright  VHI. All rights reserved.

## 2015-02-05 ENCOUNTER — Ambulatory Visit (HOSPITAL_COMMUNITY): Payer: Medicaid Other

## 2015-02-07 ENCOUNTER — Encounter (HOSPITAL_COMMUNITY): Payer: Self-pay | Admitting: Occupational Therapy

## 2015-02-08 ENCOUNTER — Ambulatory Visit (HOSPITAL_COMMUNITY): Payer: Medicaid Other | Attending: Family Medicine | Admitting: Occupational Therapy

## 2015-02-08 ENCOUNTER — Encounter (HOSPITAL_COMMUNITY): Payer: Self-pay | Admitting: Occupational Therapy

## 2015-02-08 DIAGNOSIS — M79601 Pain in right arm: Secondary | ICD-10-CM | POA: Diagnosis present

## 2015-02-08 DIAGNOSIS — R531 Weakness: Secondary | ICD-10-CM

## 2015-02-08 DIAGNOSIS — R279 Unspecified lack of coordination: Secondary | ICD-10-CM | POA: Diagnosis present

## 2015-02-08 DIAGNOSIS — M25611 Stiffness of right shoulder, not elsewhere classified: Secondary | ICD-10-CM | POA: Insufficient documentation

## 2015-02-08 DIAGNOSIS — R29898 Other symptoms and signs involving the musculoskeletal system: Secondary | ICD-10-CM

## 2015-02-08 DIAGNOSIS — M6289 Other specified disorders of muscle: Secondary | ICD-10-CM | POA: Insufficient documentation

## 2015-02-08 DIAGNOSIS — M6281 Muscle weakness (generalized): Secondary | ICD-10-CM | POA: Diagnosis not present

## 2015-02-08 NOTE — Therapy (Signed)
[facility] [address] [place], [place], [address] Phone: [phone]   Fax:  [phone]  Occupational Therapy Treatment  Patient Details  Name: Megan Holloway MRN: [id number] Date of Birth: July 25, 1960 Referring Provider: Irene Limbo  Encounter Date: 02/08/2015      OT End of Session - 02/08/15 1200    Visit Number 3   Number of Visits 12   Date for OT Re-Evaluation 03/27/15   Authorization Type Self Pay. Patient is working on Health visitor and Disability. Informed patient of approximately how much each session would be.   OT Start Time 1104   OT Stop Time 1148   OT Time Calculation (min) 44 min   Activity Tolerance Patient tolerated treatment well   Behavior During Therapy WFL for tasks assessed/performed      Past Medical History  Diagnosis Date  . Vertigo   . Dyslipidemia 01/05/2015    Past Surgical History  Procedure Laterality Date  . Uterine fibroid surgery    . Cesarean section      There were no vitals filed for this visit.  Visit Diagnosis:  Decreased grip strength of right hand  Lack of coordination  Right sided weakness      Subjective Assessment - 02/08/15 1106    Subjective  S: Could I have another stroke?    Currently in Pain? Yes   Pain Score 8    Pain Location Arm   Pain Orientation Right   Pain Descriptors / Indicators Constant;Tender;Grimacing;Discomfort;Aching   Pain Type Acute pain   Pain Radiating Towards none   Pain Onset More than a month ago   Pain Frequency Constant   Aggravating Factors  people touching arm   Pain Relieving Factors none   Effect of Pain on Daily Activities limited ability to use RUE during daily tasks   Multiple Pain Sites No            [facility] OT Assessment - 02/08/15 1158    Assessment   Diagnosis Right side weakness   Precautions   Precautions Other (comment)   Precaution Comments Decreased sensation in right Hand                  OT  Treatments/Exercises (OP) - 02/08/15 1109    Exercises   Exercises Hand   Hand Exercises   Theraputty Flatten;Roll;Grip;Pinch   Theraputty - Flatten yellow   Theraputty - Roll yellow   Theraputty - Grip yellow pronated & supinated   Theraputty - Pinch yellow 3 pt and lateral   Hand Gripper with Large Beads 6/6 with gripper set at 7#   Hand Gripper with Medium Beads 12/12 beads gripper set at 7#   Hand Gripper with Small Beads unable to complete   Sponges 14, 12    Other Hand Exercises pinch tree: pt placed resistive clothespins with 3 pt pinch and removed with lateral pinch. Pt able to complete yellow, red, green, and 4 blue clothespins. Increased time required, 1 rest break                OT Education - 02/08/15 1200    Education provided Yes   Education Details Provided fact sheet on pain post-stroke   Person(s) Educated Patient   Methods Explanation;Handout   Comprehension Verbalized understanding          OT Short Term Goals - 02/02/15 1521    OT SHORT TERM GOAL #1   Title Patient will be educated and independent with HEP  allowing for right UE to return as dominant extremity.    Time 3   Period Weeks   Status On-going   OT SHORT TERM GOAL #2   Title Patient will increase coordination in Right hand by decreasing 9 hole peg test time by 20 seconds.    Time 3   Period Weeks   Status On-going   OT SHORT TERM GOAL #3   Title Patient will increase grip strength by 10# and pinch strength by 5# to increase ability to hold onto items without dropping them.    Time 3   Period Weeks   Status On-going   OT SHORT TERM GOAL #4   Title Patient will increase RUE strength to 4/5 to increase ability to return to normal work activities.    Time 3   Period Weeks   Status On-going   OT SHORT TERM GOAL #5   Title Patient will report a decrease in pain level in RUE to 4/10 or less during daily tasks.    Time 3   Period Weeks   Status On-going   Additional Short Term Goals    Additional Short Term Goals Yes   OT SHORT TERM GOAL #6   Title Patient will increase P/ROM of RUE to [facility] to increase ability to use arm for functional tasks.    Time 3   Period Weeks   Status New           OT Long Term Goals - 02/02/15 1521    OT LONG TERM GOAL #1   Title Patient will return to highest level of independence with all daily tasks using RUE as dominant extremity.    Time 6   Period Weeks   Status On-going   OT LONG TERM GOAL #2   Title Patient will increase coordination by decreasing 9 hole peg test time by 20 seconds allowing for an increased ability to do buttons and zippers with RUE.    Time 6   Period Weeks   Status On-going   OT LONG TERM GOAL #3   Title Patient will increase RUE strength to 4+/5 to increase ability to complete work related tasks using RUE.   Time 6   Period Weeks   Status On-going   OT LONG TERM GOAL #4   Title Patient will decrease pain level in RUE to 2/10 or less when completing daily tasks.    Time 6   Period Weeks   Status On-going   OT LONG TERM GOAL #5   Title Patient will increase grip strength by 15# and pinch strength by 10# to increase ability to hold on items and comb hair with more independence.    Time 6   Period Weeks   Status On-going   Long Term Additional Goals   Additional Long Term Goals Yes   OT LONG TERM GOAL #6   Title Patient will increase A/ROM of RUE to [facility] to increase ability to complete tasks at shoulder height.               Plan - 02/08/15 1200    Clinical Impression Statement A: Pt comes in with small burn on right dorsal forearm, reports this was from trying to put cheese toast in oven. Pt very anxious and asking questions about possibility of having additional stroke and what should she do to prevent this. Educated pt on adhering to MD instructions including medication use and diet. Completed pinch and grip tasks this session, increased time for  pt to complete tasks. Pt continues to have  wrist pain during grip tasks, using fingertip pinch on gripper verus gripping with hand.    Plan P: Provide pt with stroke prevention fact sheet. Attempt to work on grip strength with correct grasp. Add fine motor coordination activity        Problem List Patient Active Problem List   Diagnosis Date Noted  . Dyslipidemia 01/05/2015  . CVA (cerebral infarction) 01/03/2015    Ezra Sites, OTR/L  [phone]  02/08/2015, 12:05 PM  [facility] [address] [place], [place], [address] Phone: [phone]   Fax:  [phone]  Name: Marenda Accardi MRN: [id number] Date of Birth: 05-Aug-1960

## 2015-02-12 ENCOUNTER — Encounter (HOSPITAL_COMMUNITY): Payer: Self-pay | Admitting: Specialist

## 2015-02-14 ENCOUNTER — Encounter (HOSPITAL_COMMUNITY): Payer: Self-pay | Admitting: Occupational Therapy

## 2015-02-14 ENCOUNTER — Telehealth (HOSPITAL_COMMUNITY): Payer: Self-pay

## 2015-02-14 ENCOUNTER — Ambulatory Visit (HOSPITAL_COMMUNITY): Payer: Medicaid Other | Admitting: Occupational Therapy

## 2015-02-14 DIAGNOSIS — M6281 Muscle weakness (generalized): Secondary | ICD-10-CM | POA: Diagnosis not present

## 2015-02-14 DIAGNOSIS — R29898 Other symptoms and signs involving the musculoskeletal system: Secondary | ICD-10-CM

## 2015-02-14 DIAGNOSIS — R531 Weakness: Secondary | ICD-10-CM

## 2015-02-14 DIAGNOSIS — M25611 Stiffness of right shoulder, not elsewhere classified: Secondary | ICD-10-CM

## 2015-02-14 DIAGNOSIS — R279 Unspecified lack of coordination: Secondary | ICD-10-CM

## 2015-02-14 NOTE — Therapy (Signed)
[facility] [address] [place], [place], [address] Phone: [phone]   Fax:  [phone]  Occupational Therapy Treatment  Patient Details  Name: Megan Holloway MRN: [id number] Date of Birth: [birth year] Referring Provider: Irene Limbo  Encounter Date: 02/14/2015      OT End of Session - 02/14/15 1409    Visit Number 4   Number of Visits 12   Date for OT Re-Evaluation 03/27/15   Authorization Type Self Pay. Patient is working on Health visitor and Disability. Informed patient of approximately how much each session would be.   OT Start Time 1300   OT Stop Time 1345   OT Time Calculation (min) 45 min   Activity Tolerance Patient tolerated treatment well   Behavior During Therapy WFL for tasks assessed/performed      Past Medical History  Diagnosis Date  . Vertigo   . Dyslipidemia 01/05/2015    Past Surgical History  Procedure Laterality Date  . Uterine fibroid surgery    . Cesarean section      There were no vitals filed for this visit.  Visit Diagnosis:  Decreased grip strength of right hand  Lack of coordination  Right sided weakness  Stiffness of shoulder joint, right      Subjective Assessment - 02/14/15 1255    Currently in Pain? Yes   Pain Score 8    Pain Location Arm   Pain Orientation Right   Pain Descriptors / Indicators Aching;Constant;Tender;Discomfort   Pain Type Acute pain   Pain Radiating Towards hand   Pain Onset More than a month ago   Pain Frequency Constant   Aggravating Factors  items/people touching arm, use   Pain Relieving Factors none   Effect of Pain on Daily Activities inability to use RUE for daily tasks   Multiple Pain Sites No            [facility] OT Assessment - 02/14/15 1254    Assessment   Diagnosis Right side weakness   Precautions   Precautions Other (comment)   Precaution Comments Decreased sensation in right Hand                  OT Treatments/Exercises  (OP) - 02/14/15 1307    Exercises   Exercises Hand;Shoulder   Shoulder Exercises: Supine   Flexion PROM;10 reps   Flexion Limitations unable to passively stretch past 20% range   ABduction PROM;10 reps   ABduction Limitations unable to passively stretch past 20% range   Shoulder Exercises: Therapy Ball   Flexion 10 reps   Hand Exercises   Hand Gripper with Large Beads 6/6 with gripper set at 7#   Hand Gripper with Medium Beads 12/12 beads gripper set at 7#   Hand Gripper with Small Beads unable to complete   Modalities   Modalities Moist Heat   Moist Heat Therapy   Number Minutes Moist Heat 5 Minutes   Moist Heat Location Shoulder   Manual Therapy   Manual Therapy Myofascial release   Manual therapy comments manual therapy completed separately to all exercises and activities   Myofascial Release Myofascial release to right upper arm, trapezius, and scapularis regions to decrease fascial restrictions and pain, and increase joint range of motion                  OT Short Term Goals - 02/02/15 1521    OT SHORT TERM GOAL #1   Title Patient will be educated and independent with HEP  allowing for right UE to return as dominant extremity.    Time 3   Period Weeks   Status On-going   OT SHORT TERM GOAL #2   Title Patient will increase coordination in Right hand by decreasing 9 hole peg test time by 20 seconds.    Time 3   Period Weeks   Status On-going   OT SHORT TERM GOAL #3   Title Patient will increase grip strength by 10# and pinch strength by 5# to increase ability to hold onto items without dropping them.    Time 3   Period Weeks   Status On-going   OT SHORT TERM GOAL #4   Title Patient will increase RUE strength to 4/5 to increase ability to return to normal work activities.    Time 3   Period Weeks   Status On-going   OT SHORT TERM GOAL #5   Title Patient will report a decrease in pain level in RUE to 4/10 or less during daily tasks.    Time 3   Period Weeks    Status On-going   Additional Short Term Goals   Additional Short Term Goals Yes   OT SHORT TERM GOAL #6   Title Patient will increase P/ROM of RUE to [facility] to increase ability to use arm for functional tasks.    Time 3   Period Weeks   Status New           OT Long Term Goals - 02/02/15 1521    OT LONG TERM GOAL #1   Title Patient will return to highest level of independence with all daily tasks using RUE as dominant extremity.    Time 6   Period Weeks   Status On-going   OT LONG TERM GOAL #2   Title Patient will increase coordination by decreasing 9 hole peg test time by 20 seconds allowing for an increased ability to do buttons and zippers with RUE.    Time 6   Period Weeks   Status On-going   OT LONG TERM GOAL #3   Title Patient will increase RUE strength to 4+/5 to increase ability to complete work related tasks using RUE.   Time 6   Period Weeks   Status On-going   OT LONG TERM GOAL #4   Title Patient will decrease pain level in RUE to 2/10 or less when completing daily tasks.    Time 6   Period Weeks   Status On-going   OT LONG TERM GOAL #5   Title Patient will increase grip strength by 15# and pinch strength by 10# to increase ability to hold on items and comb hair with more independence.    Time 6   Period Weeks   Status On-going   Long Term Additional Goals   Additional Long Term Goals Yes   OT LONG TERM GOAL #6   Title Patient will increase A/ROM of RUE to [facility] to increase ability to complete tasks at shoulder height.               Plan - 02/14/15 1409    Clinical Impression Statement A: Pt with increased soreness in right upper arm this session, unable/unwilling to lift arm off table to place beads in container during gripper exercise. Instructed pt in holding gripper with whole hand versus fingertips only, pt tried once and states she can't operate it like that due to "that little finger hurting." Pt unable to complete small beads this session.  Provided pt  with stroke prevention fact sheet. Educated pt on importance of completing table slides and grip theraputty exercises at home.    Plan P: Continue working on shoulder P/ROM as able to tolerate. Add fine motor activity.         Problem List Patient Active Problem List   Diagnosis Date Noted  . Dyslipidemia 01/05/2015  . CVA (cerebral infarction) 01/03/2015    Ezra Sites, OTR/L  [phone]  02/14/2015, 2:25 PM   [facility] [address] [place], [place], [address] Phone: [phone]   Fax:  [phone]  Name: Annleigh Knueppel MRN: [id number] Date of Birth: [birth year]

## 2015-02-14 NOTE — Telephone Encounter (Signed)
no transportation °

## 2015-02-16 ENCOUNTER — Encounter (HOSPITAL_COMMUNITY): Payer: Self-pay

## 2015-02-19 ENCOUNTER — Ambulatory Visit (HOSPITAL_COMMUNITY): Payer: Medicaid Other | Admitting: Specialist

## 2015-02-19 DIAGNOSIS — M79601 Pain in right arm: Secondary | ICD-10-CM

## 2015-02-19 DIAGNOSIS — M6281 Muscle weakness (generalized): Secondary | ICD-10-CM | POA: Diagnosis not present

## 2015-02-19 NOTE — Therapy (Signed)
[facility] [address] [place], [place], [address] Phone: [phone]   Fax:  [phone]  Occupational Therapy Treatment  Patient Details  Name: Megan Holloway MRN: [id number] Date of Birth: Apr 28, 1960 Referring Provider: Dr. Brendia Sacks  Encounter Date: 02/19/2015      OT End of Session - 02/19/15 1659    Visit Number 5   Number of Visits 12   Date for OT Re-Evaluation 03/27/15   Authorization Type Self Pay. Patient is working on Health visitor and Disability. Informed patient of approximately how much each session would be.   OT Start Time 1358   OT Stop Time 1422   OT Time Calculation (min) 24 min   Activity Tolerance Patient limited by pain;Other (comment)  see assessment   Behavior During Therapy Flat affect      Past Medical History  Diagnosis Date  . Vertigo   . Dyslipidemia 01/05/2015    Past Surgical History  Procedure Laterality Date  . Uterine fibroid surgery    . Cesarean section      There were no vitals filed for this visit.  Visit Diagnosis:  Arm pain, right      Subjective Assessment - 02/19/15 1401    Subjective  S:  My shoulder is staying a constant 8/10 pain.    Currently in Pain? Yes   Pain Score 8    Pain Location Shoulder   Pain Orientation Right   Pain Descriptors / Indicators Aching   Pain Type Acute pain   Pain Radiating Towards hand   Pain Onset More than a month ago   Pain Frequency Constant   Aggravating Factors  items/people touching arm, use   Pain Relieving Factors none   Effect of Pain on Daily Activities inability to use RUE for daily tasks   Multiple Pain Sites No            [facility] OT Assessment - 02/19/15 0001    Assessment   Diagnosis Right side weakness   Referring Provider Dr. Brendia Sacks   Precautions   Precautions Other (comment)   Precaution Comments Decreased sensation in right Hand                  OT Treatments/Exercises (OP) -  02/19/15 0001    Exercises   Exercises Hand;Shoulder   Shoulder Exercises: Supine   Flexion PROM;AAROM;Limitations   Flexion Limitations patient resists P/ROM past 10% range, attempted AA/ROM and patient unable to complete more than 10% range due to pain   ABduction PROM;AAROM  2 repetitions   ABduction Limitations patient resisted past 10% of P/ROM and AA/ROM due to pain   Shoulder Exercises: Standing   Other Standing Exercises attempted AA/ROM exercise with PVC pipe in corner, patient required max verbal cues and tactile cues to assume correct position for exercise and then able to move arm approx 5 degrees in 5 minutes    Shoulder Exercises: Stretch   Table Stretch - Flexion 3 reps   Table Stretch -Flexion Limitations required 5 minutes to flex forward 10 inches in 3 times    Hand Exercises   Sponges 9, 10 - very labored, would not pick arm up off of table   Manual Therapy   Manual Therapy Myofascial release   Manual therapy comments manual therapy completed separately to all exercises and activities   Myofascial Release Myofascial release to right upper arm, trapezius, and scapularis regions to decrease fascial restrictions and pain, and increase joint range  of motion                  OT Short Term Goals - 02/02/15 1521    OT SHORT TERM GOAL #1   Title Patient will be educated and independent with HEP allowing for right UE to return as dominant extremity.    Time 3   Period Weeks   Status On-going   OT SHORT TERM GOAL #2   Title Patient will increase coordination in Right hand by decreasing 9 hole peg test time by 20 seconds.    Time 3   Period Weeks   Status On-going   OT SHORT TERM GOAL #3   Title Patient will increase grip strength by 10# and pinch strength by 5# to increase ability to hold onto items without dropping them.    Time 3   Period Weeks   Status On-going   OT SHORT TERM GOAL #4   Title Patient will increase RUE strength to 4/5 to increase ability  to return to normal work activities.    Time 3   Period Weeks   Status On-going   OT SHORT TERM GOAL #5   Title Patient will report a decrease in pain level in RUE to 4/10 or less during daily tasks.    Time 3   Period Weeks   Status On-going   Additional Short Term Goals   Additional Short Term Goals Yes   OT SHORT TERM GOAL #6   Title Patient will increase P/ROM of RUE to [facility] to increase ability to use arm for functional tasks.    Time 3   Period Weeks   Status New           OT Long Term Goals - 02/02/15 1521    OT LONG TERM GOAL #1   Title Patient will return to highest level of independence with all daily tasks using RUE as dominant extremity.    Time 6   Period Weeks   Status On-going   OT LONG TERM GOAL #2   Title Patient will increase coordination by decreasing 9 hole peg test time by 20 seconds allowing for an increased ability to do buttons and zippers with RUE.    Time 6   Period Weeks   Status On-going   OT LONG TERM GOAL #3   Title Patient will increase RUE strength to 4+/5 to increase ability to complete work related tasks using RUE.   Time 6   Period Weeks   Status On-going   OT LONG TERM GOAL #4   Title Patient will decrease pain level in RUE to 2/10 or less when completing daily tasks.    Time 6   Period Weeks   Status On-going   OT LONG TERM GOAL #5   Title Patient will increase grip strength by 15# and pinch strength by 10# to increase ability to hold on items and comb hair with more independence.    Time 6   Period Weeks   Status On-going   Long Term Additional Goals   Additional Long Term Goals Yes   OT LONG TERM GOAL #6   Title Patient will increase A/ROM of RUE to [facility] to increase ability to complete tasks at shoulder height.               Plan - 02/19/15 1659    Clinical Impression Statement A:  Patient reports her pain is the same.  Patient forcefully resists P/ROM past 10% or flexion or  abduction, any AA/ROM requested by therapist  is very labored.  Therapist did observe patient reach across body with right arm to pick lint off of her shirt with her right hand without apparent signs of pain or difficulty.  when walking, patient is able to swing arm in normal gait rhthym.   Plan P:  Engage patient in functional activities that will promote use of her right arm.  Patient is limited by pain to participate in activities this date.  Discuss progress thus far in therapy with client.        Problem List Patient Active Problem List   Diagnosis Date Noted  . Dyslipidemia 01/05/2015  . CVA (cerebral infarction) 01/03/2015    Shirlean Mylar, OTR/L [phone]  02/19/2015, 5:38 PM  [place] [address] [place], [place], [address] Phone: [phone]   Fax:  [phone]  Name: Megan Holloway MRN: [id number] Date of Birth: 1960/08/03

## 2015-02-21 ENCOUNTER — Encounter (HOSPITAL_COMMUNITY): Payer: Self-pay

## 2015-02-21 ENCOUNTER — Ambulatory Visit (HOSPITAL_COMMUNITY): Payer: Medicaid Other

## 2015-02-21 DIAGNOSIS — M25611 Stiffness of right shoulder, not elsewhere classified: Secondary | ICD-10-CM

## 2015-02-21 DIAGNOSIS — R29898 Other symptoms and signs involving the musculoskeletal system: Secondary | ICD-10-CM

## 2015-02-21 DIAGNOSIS — R531 Weakness: Secondary | ICD-10-CM

## 2015-02-21 DIAGNOSIS — R279 Unspecified lack of coordination: Secondary | ICD-10-CM

## 2015-02-21 DIAGNOSIS — M6281 Muscle weakness (generalized): Secondary | ICD-10-CM | POA: Diagnosis not present

## 2015-02-21 NOTE — Therapy (Signed)
[facility] [address] [place], [place], [address] Phone: [phone]   Fax:  [phone]  Occupational Therapy Treatment  Patient Details  Name: Megan Holloway MRN: [id number] Date of Birth: [birth year] Referring Provider: Dr. Brendia Sacks  Encounter Date: 02/21/2015      OT End of Session - 02/21/15 1509    Visit Number 6   Number of Visits 12   Date for OT Re-Evaluation 03/27/15   Authorization Type Self Pay. Patient is working on Health visitor and Disability. Informed patient of approximately how much each session would be.   OT Start Time 1330   OT Stop Time 1415   OT Time Calculation (min) 45 min   Activity Tolerance Patient limited by pain;Other (comment)   Behavior During Therapy Flat affect      Past Medical History  Diagnosis Date  . Vertigo   . Dyslipidemia 01/05/2015    Past Surgical History  Procedure Laterality Date  . Uterine fibroid surgery    . Cesarean section      There were no vitals filed for this visit.  Visit Diagnosis:  Decreased grip strength of right hand  Lack of coordination  Right sided weakness  Stiffness of shoulder joint, right  Muscle weakness of right upper extremity      Subjective Assessment - 02/21/15 1503    Subjective  S; My vision is still bad.   Currently in Pain? Yes   Pain Score 8    Pain Location Shoulder   Pain Orientation Right   Pain Descriptors / Indicators Aching   Pain Type Acute pain   Pain Radiating Towards hand   Pain Onset More than a month ago   Pain Frequency Constant            [facility] OT Assessment - 02/21/15 1504    Assessment   Diagnosis Right side weakness   Precautions   Precautions Other (comment)   Precaution Comments Decreased sensation in right Hand                  OT Treatments/Exercises (OP) - 02/21/15 1504    Exercises   Exercises Hand;Shoulder   Hand Exercises   Theraputty - Roll red   Theraputty - Grip  red   Theraputty - Pinch red - 3 point   Fine Motor Coordination   Fine Motor Coordination Flipping cards;Grooved pegs   Flipping cards Pt flipped deck of cards over one at a time with right hand. Min difficulty with increased time and effort.    Grooved pegs Pt completed 6 pegs into pegboard to increase coordination.Task was terminated due to patient's report of decreased vision and inability to see holes in pegboard.    Functional Reaching Activities   High Level Functional reaching task completed in ADL kitchen. Pt retrieved all baking items from first shelf and placed on countertop as well as placed them back. Pt was able to complete all aspects of task with increased time and the appearance of difficulty. Pt's movements were slow and cautious.                OT Education - 02/21/15 1507    Education provided Yes   Education Details phone numbers and resources from [employer] [place] website: vision specialist, vocational services, [place] state helpline. [place] Support group.   Person(s) Educated Patient   Methods Explanation   Comprehension Verbalized understanding          OT  Short Term Goals - 02/02/15 1521    OT SHORT TERM GOAL #1   Title Patient will be educated and independent with HEP allowing for right UE to return as dominant extremity.    Time 3   Period Weeks   Status On-going   OT SHORT TERM GOAL #2   Title Patient will increase coordination in Right hand by decreasing 9 hole peg test time by 20 seconds.    Time 3   Period Weeks   Status On-going   OT SHORT TERM GOAL #3   Title Patient will increase grip strength by 10# and pinch strength by 5# to increase ability to hold onto items without dropping them.    Time 3   Period Weeks   Status On-going   OT SHORT TERM GOAL #4   Title Patient will increase RUE strength to 4/5 to increase ability to return to normal work activities.    Time 3   Period Weeks   Status On-going   OT SHORT  TERM GOAL #5   Title Patient will report a decrease in pain level in RUE to 4/10 or less during daily tasks.    Time 3   Period Weeks   Status On-going   Additional Short Term Goals   Additional Short Term Goals Yes   OT SHORT TERM GOAL #6   Title Patient will increase P/ROM of RUE to [facility] to increase ability to use arm for functional tasks.    Time 3   Period Weeks   Status New           OT Long Term Goals - 02/02/15 1521    OT LONG TERM GOAL #1   Title Patient will return to highest level of independence with all daily tasks using RUE as dominant extremity.    Time 6   Period Weeks   Status On-going   OT LONG TERM GOAL #2   Title Patient will increase coordination by decreasing 9 hole peg test time by 20 seconds allowing for an increased ability to do buttons and zippers with RUE.    Time 6   Period Weeks   Status On-going   OT LONG TERM GOAL #3   Title Patient will increase RUE strength to 4+/5 to increase ability to complete work related tasks using RUE.   Time 6   Period Weeks   Status On-going   OT LONG TERM GOAL #4   Title Patient will decrease pain level in RUE to 2/10 or less when completing daily tasks.    Time 6   Period Weeks   Status On-going   OT LONG TERM GOAL #5   Title Patient will increase grip strength by 15# and pinch strength by 10# to increase ability to hold on items and comb hair with more independence.    Time 6   Period Weeks   Status On-going   Long Term Additional Goals   Additional Long Term Goals Yes   OT LONG TERM GOAL #6   Title Patient will increase A/ROM of RUE to [facility] to increase ability to complete tasks at shoulder height.               Plan - 02/21/15 1509    Clinical Impression Statement A: Inquired how patient was really doing at home with everything that has been going on. patient became very tearful and stated that things were hard and not good. Patient has had a difficult time adjusting after the stroke.  Pt's daughter  lives in [place] and comes in the morning every other day with breakfast, lunch, and dinner meals. Pt is otherwise on her own to fend for herself. Pt reports difficulty in the kitchen related to retrieving items from the oven and  cutting up food items. Patient was given phone numbers and resources that may assist her. Recommended just having someone to talk to for support would be a good idea as she apears to be very overwhelmed. Patient continues to report increased pain in right UE which remains at a constant 8/10 pain. Patient completes therapy activities with increased time and very labored movements. Pt is able to use her cell phone without difficulty and was seen pushing the door hand down and pushing the heavy waiting room door open when leaving clinic this date.    Plan P: Provide education related to vision and safety in the kitchen. (contrast, long oven mitts, using contrasting cutting board).         Problem List Patient Active Problem List   Diagnosis Date Noted  . Dyslipidemia 01/05/2015  . CVA (cerebral infarction) 01/03/2015    Megan Holloway, OTR/L,CBIS  [phone]  02/21/2015, 3:16 PM  [facility] [address] [place], [place], [address] Phone: [phone]   Fax:  [phone]  Name: Megan Holloway MRN: [id number] Date of Birth: 1960-11-03

## 2015-02-26 ENCOUNTER — Ambulatory Visit (HOSPITAL_COMMUNITY): Payer: Medicaid Other | Admitting: Specialist

## 2015-02-26 DIAGNOSIS — R531 Weakness: Secondary | ICD-10-CM

## 2015-02-26 DIAGNOSIS — R29898 Other symptoms and signs involving the musculoskeletal system: Secondary | ICD-10-CM

## 2015-02-26 DIAGNOSIS — M6281 Muscle weakness (generalized): Secondary | ICD-10-CM | POA: Diagnosis not present

## 2015-02-26 DIAGNOSIS — M79601 Pain in right arm: Secondary | ICD-10-CM

## 2015-02-26 DIAGNOSIS — R279 Unspecified lack of coordination: Secondary | ICD-10-CM

## 2015-02-26 NOTE — Therapy (Signed)
[facility] [address] [place], [place], [address] Phone: [phone]   Fax:  [phone]  Occupational Therapy Treatment  Patient Details  Name: Megan Holloway MRN: [id number] Date of Birth: 1960-08-02 Referring Provider: Dr. Brendia Sacks  Encounter Date: 02/26/2015      OT End of Session - 02/26/15 1503    Visit Number 7   Number of Visits 12   Date for OT Re-Evaluation 03/27/15   Authorization Type Self Pay. Patient is working on Health visitor and Disability. Informed patient of approximately how much each session would be.   OT Start Time 1352   OT Stop Time 1430   OT Time Calculation (min) 38 min   Activity Tolerance Patient tolerated treatment well   Behavior During Therapy WFL for tasks assessed/performed      Past Medical History  Diagnosis Date  . Vertigo   . Dyslipidemia 01/05/2015    Past Surgical History  Procedure Laterality Date  . Uterine fibroid surgery    . Cesarean section      There were no vitals filed for this visit.  Visit Diagnosis:  Decreased grip strength of right hand  Lack of coordination  Right sided weakness  Arm pain, right      Subjective Assessment - 02/26/15 1449    Subjective    S:  I cant use my right index and pinky finger.  I have a bad headache, and my arm hurts    Currently in Pain? Yes   Pain Score 10-Worst pain ever   Pain Location Shoulder   Pain Orientation Right   Pain Descriptors / Indicators Aching   Pain Type Acute pain   Pain Radiating Towards hand   Pain Onset More than a month ago   Pain Frequency Constant   Aggravating Factors  items/people touching arm, use   Pain Relieving Factors none   Effect of Pain on Daily Activities inablility to use rigt upper extremity for daily task   Multiple Pain Sites Yes   Pain Score 10   Pain Location Head   Pain Descriptors / Indicators Aching   Pain Type Acute pain   Pain Onset In the past 7 days   Pain  Frequency Constant   Aggravating Factors  everything   Pain Relieving Factors nothing   Effect of Pain on Daily Activities hard to concentrate            [facility] OT Assessment - 02/26/15 0001    Assessment   Diagnosis Right side weakness   Precautions   Precautions Other (comment)   Precaution Comments Decreased sensation in right Hand                  OT Treatments/Exercises (OP) - 02/26/15 0001    ADLs   Cooking Patient states that she can't use a knife with her right hand.  Therapist set patient up with white cutting board for improved contrast, knife and putty to cut.  Patient attempted to hold knife between index and long digits and not gripping handle at all.  OT cued patient to grip knife and patient responded that she could not use her index and small digit.  Therapist inquired as to why - as she has been observed gripping items in the clinic, picking lint of shirt with index finger, etc.  Patient stated she didnt know she just couldnt.  Therapist provided max hand over hand assist to position patients right hand on knife with long, ring,  small digit holding knife handle index finger on top of knife and thumb balancing.  with max pa she was able to cut the putty 2 times, therapist then withdrew and patient maintained hand position to cut putty 2 times.  therapist then requested that she put the knife down and begin again independently positioning her hand on the knife.  She was able to do so with mod vg.  Then transitioned to cutting actual bread roll in half with same level of verbal guidance.  After cutting complete therapist requested taht she wash the cutting board and knife.  Required max vg to use her right hand to wash and pick up items.  patient would switch to right hand and then switch right back.  patietn able to see very small piece of putty left on cutting board without prompting and wash it off.    ADL Education Given Yes   Increased Safety Strategies following up  from last session, in which patient states that she has trouble with her vision OTR/L provided handouts on use of contrast to improve ability to complete daily tasks safely.  Discussed what contrast was and how to apply to each ADL activity.  Patient attempted to read beginning of handout, and was haivng trouble with pronunciation of words, was able to read across entire page. Discussed strategies to improve sagety when retrieving items from oven - use of long arm oven mit, and use of chopping containers vs use of knife for cutting food items.  Patient did not voice any concern with educational information given to her.                   OT Short Term Goals - 02/02/15 1521    OT SHORT TERM GOAL #1   Title Patient will be educated and independent with HEP allowing for right UE to return as dominant extremity.    Time 3   Period Weeks   Status On-going   OT SHORT TERM GOAL #2   Title Patient will increase coordination in Right hand by decreasing 9 hole peg test time by 20 seconds.    Time 3   Period Weeks   Status On-going   OT SHORT TERM GOAL #3   Title Patient will increase grip strength by 10# and pinch strength by 5# to increase ability to hold onto items without dropping them.    Time 3   Period Weeks   Status On-going   OT SHORT TERM GOAL #4   Title Patient will increase RUE strength to 4/5 to increase ability to return to normal work activities.    Time 3   Period Weeks   Status On-going   OT SHORT TERM GOAL #5   Title Patient will report a decrease in pain level in RUE to 4/10 or less during daily tasks.    Time 3   Period Weeks   Status On-going   Additional Short Term Goals   Additional Short Term Goals Yes   OT SHORT TERM GOAL #6   Title Patient will increase P/ROM of RUE to [facility] to increase ability to use arm for functional tasks.    Time 3   Period Weeks   Status New           OT Long Term Goals - 02/02/15 1521    OT LONG TERM GOAL #1   Title Patient  will return to highest level of independence with all daily tasks using RUE as dominant extremity.  Time 6   Period Weeks   Status On-going   OT LONG TERM GOAL #2   Title Patient will increase coordination by decreasing 9 hole peg test time by 20 seconds allowing for an increased ability to do buttons and zippers with RUE.    Time 6   Period Weeks   Status On-going   OT LONG TERM GOAL #3   Title Patient will increase RUE strength to 4+/5 to increase ability to complete work related tasks using RUE.   Time 6   Period Weeks   Status On-going   OT LONG TERM GOAL #4   Title Patient will decrease pain level in RUE to 2/10 or less when completing daily tasks.    Time 6   Period Weeks   Status On-going   OT LONG TERM GOAL #5   Title Patient will increase grip strength by 15# and pinch strength by 10# to increase ability to hold on items and comb hair with more independence.    Time 6   Period Weeks   Status On-going   Long Term Additional Goals   Additional Long Term Goals Yes   OT LONG TERM GOAL #6   Title Patient will increase A/ROM of RUE to [facility] to increase ability to complete tasks at shoulder height.               Plan - 02/26/15 1503    Clinical Impression Statement A:  Reviewed strategies to improve safety with ADLs by using contrast at length.  Patient did not have questions regarding this information.  Patient states that she can't use her right index and small digits, despite physically being able to use them and therapist observing volitional use of her right hand in clinic pulling chair out to sit down and picking lint off of her shirt.  Provided max hand over hand assist to maneuver knife and gradually was able to back off assistance so that patient was completing independently.  When washing dishes, patient require max vg to use her right arm to wash and handle dishes.  Patient would switch to right for less than 30 seconds and then switch back to left.     Plan P:   Follow up on use of contrast at home.  Complete functional activities with left arm restrained to encourage use of right arm.  Begin stress loading exercises:  scrubbing and carrying  with right hand to improve functional use of right arm.  on 2/27 prepare spaghetti.          Problem List Patient Active Problem List   Diagnosis Date Noted  . Dyslipidemia 01/05/2015  . CVA (cerebral infarction) 01/03/2015    Shirlean Mylar, OTR/L [phone]  02/26/2015, 3:07 PM  [place] [facility] [address] [place], [place], [address] Phone: [phone]   Fax:  [phone]  Name: Curstin Schmale MRN: [id number] Date of Birth: 1960-06-15

## 2015-02-28 ENCOUNTER — Ambulatory Visit (HOSPITAL_COMMUNITY): Payer: Medicaid Other | Admitting: Occupational Therapy

## 2015-02-28 ENCOUNTER — Encounter (HOSPITAL_COMMUNITY): Payer: Self-pay | Admitting: Occupational Therapy

## 2015-02-28 DIAGNOSIS — M25611 Stiffness of right shoulder, not elsewhere classified: Secondary | ICD-10-CM

## 2015-02-28 DIAGNOSIS — M6281 Muscle weakness (generalized): Secondary | ICD-10-CM | POA: Diagnosis not present

## 2015-02-28 DIAGNOSIS — M79601 Pain in right arm: Secondary | ICD-10-CM

## 2015-02-28 DIAGNOSIS — R29898 Other symptoms and signs involving the musculoskeletal system: Secondary | ICD-10-CM

## 2015-02-28 NOTE — Therapy (Signed)
[facility] [address] [place], [place], [address] Phone: [phone]   Fax:  [phone]  Occupational Therapy Treatment  Patient Details  Name: Megan Holloway MRN: [id number] Date of Birth: May 30, 1960 Referring Provider: Dr. Brendia Sacks  Encounter Date: 02/28/2015      OT End of Session - 02/28/15 1550    Visit Number 8   Number of Visits 12   Date for OT Re-Evaluation 03/27/15   Authorization Type Self Pay. Patient is working on Health visitor and Disability. Informed patient of approximately how much each session would be.   OT Start Time 1305   OT Stop Time 1348   OT Time Calculation (min) 43 min   Activity Tolerance Patient tolerated treatment well   Behavior During Therapy WFL for tasks assessed/performed      Past Medical History  Diagnosis Date  . Vertigo   . Dyslipidemia 01/05/2015    Past Surgical History  Procedure Laterality Date  . Uterine fibroid surgery    . Cesarean section      There were no vitals filed for this visit.  Visit Diagnosis:  Decreased grip strength of right hand  Arm pain, right  Stiffness of shoulder joint, right  Muscle weakness of right upper extremity      Subjective Assessment - 02/28/15 1313    Subjective  S: I can't use my whole hand to grip because it hurts.    Currently in Pain? Yes   Pain Score 7    Pain Location Shoulder   Pain Orientation Right   Pain Descriptors / Indicators Aching   Pain Type Acute pain   Pain Radiating Towards hand   Pain Onset More than a month ago   Pain Frequency Constant   Aggravating Factors  movement, touching arm   Pain Relieving Factors none   Effect of Pain on Daily Activities limited use of RUE during daily tasks   Multiple Pain Sites No            [facility] OT Assessment - 02/28/15 1546    Assessment   Diagnosis Right side weakness   Precautions   Precautions Other (comment)   Precaution Comments Decreased sensation in  right Hand                  OT Treatments/Exercises (OP) - 02/28/15 1314    Exercises   Exercises Hand;Shoulder;Neurological Re-education   Hand Exercises   Hand Gripper with Large Beads 6/6 with gripper set at 7#   Hand Gripper with Medium Beads 12/12 beads gripper set at 7#   Hand Gripper with Small Beads unable to complete   Neurological Re-education Exercises   Weight Bearing Position Standing   Other Weight-Bearing Exercises 1 Patient participated in stress loading program. Completed scrubbing for 3 minutes and carrying 1lb weight in tote bag for 3 minutes. No complaints of increased pain.    Functional Reaching Activities   High Level Pt completed functional reaching task placing cones from counter to middle shelf and from middle shelf to top shelf of cabinets in ADL kitchen. Pt with  increase time to complete. At beginning of task pt with slow, labored movements and grimacing, however began to move cones with smoother movements and slight appearance of discomfort after the 3rd cone placement.                 OT Education - 02/28/15 1550    Education provided Yes   Education Details stress loading  program information, instructed pt on home stress loading weight & times.   Person(s) Educated Patient   Methods Explanation;Demonstration;Handout   Comprehension Verbalized understanding;Returned demonstration          OT Short Term Goals - 02/02/15 1521    OT SHORT TERM GOAL #1   Title Patient will be educated and independent with HEP allowing for right UE to return as dominant extremity.    Time 3   Period Weeks   Status On-going   OT SHORT TERM GOAL #2   Title Patient will increase coordination in Right hand by decreasing 9 hole peg test time by 20 seconds.    Time 3   Period Weeks   Status On-going   OT SHORT TERM GOAL #3   Title Patient will increase grip strength by 10# and pinch strength by 5# to increase ability to hold onto items without dropping  them.    Time 3   Period Weeks   Status On-going   OT SHORT TERM GOAL #4   Title Patient will increase RUE strength to 4/5 to increase ability to return to normal work activities.    Time 3   Period Weeks   Status On-going   OT SHORT TERM GOAL #5   Title Patient will report a decrease in pain level in RUE to 4/10 or less during daily tasks.    Time 3   Period Weeks   Status On-going   Additional Short Term Goals   Additional Short Term Goals Yes   OT SHORT TERM GOAL #6   Title Patient will increase P/ROM of RUE to [facility] to increase ability to use arm for functional tasks.    Time 3   Period Weeks   Status New           OT Long Term Goals - 02/02/15 1521    OT LONG TERM GOAL #1   Title Patient will return to highest level of independence with all daily tasks using RUE as dominant extremity.    Time 6   Period Weeks   Status On-going   OT LONG TERM GOAL #2   Title Patient will increase coordination by decreasing 9 hole peg test time by 20 seconds allowing for an increased ability to do buttons and zippers with RUE.    Time 6   Period Weeks   Status On-going   OT LONG TERM GOAL #3   Title Patient will increase RUE strength to 4+/5 to increase ability to complete work related tasks using RUE.   Time 6   Period Weeks   Status On-going   OT LONG TERM GOAL #4   Title Patient will decrease pain level in RUE to 2/10 or less when completing daily tasks.    Time 6   Period Weeks   Status On-going   OT LONG TERM GOAL #5   Title Patient will increase grip strength by 15# and pinch strength by 10# to increase ability to hold on items and comb hair with more independence.    Time 6   Period Weeks   Status On-going   Long Term Additional Goals   Additional Long Term Goals Yes   OT LONG TERM GOAL #6   Title Patient will increase A/ROM of RUE to [facility] to increase ability to complete tasks at shoulder height.               Plan - 02/28/15 1551    Clinical Impression  Statement A: Began  stress loading program consisting of scrubbing and carrying components. Pt able to complete each task with no reports of increased pain. During scrubbing program, pt only putting approximately 50% of her weight on her RUE even with verbal cuing from OT. Pt completed high level functional reaching task with less discomfort than previously experienced. Pt appeared to be in better spirits today as she has scheduled 2 appointments at [facility] in the near future. Pt also reports she has tried contrasting materials at home and it has helped with her vision.    Plan P: Prepare spaghetti using RUE. Follow up on stress loading program at home.         Problem List Patient Active Problem List   Diagnosis Date Noted  . Dyslipidemia 01/05/2015  . CVA (cerebral infarction) 01/03/2015    Ezra Sites, OTR/L  [phone]  02/28/2015, 3:55 PM  [facility] [address] [place], [place], [address] Phone: [phone]   Fax:  [phone]  Name: Megan Holloway MRN: [id number] Date of Birth: [birth year]

## 2015-02-28 NOTE — Patient Instructions (Signed)
Stress loading program therapy Claudette Laws / Lisette Grinder Protocol  Stress loading refers to a specific set of exercizes intended to improve the clinical course of patients who suffer from reflex sympathetic dystrophy. The following program has been adapted from H. Iva Lento and Hampton Abbot: Treatment of reflex sympathetic dystrophy of the hand with an active stress loading program, J Hand Surg 1987;[id number].  This program may be incorporated into the treatment of any patient with a mechanically stable hand whose condition is clearly more painful and swollen than the average patient after surgery or trauma.  Light activity or active motion is not enough. The patient is also told that increased pain and swelling may occur initially, but generally will subside within a few days.  The program is performed by the patient as follows:  Scrub:  The patient is positioned on the floor on their hands and knees, with a coarse bristled brush in the affected hand. They are told to scrub a plywood board, applying as much pressure as possible using a back and forth motion. The patient must lean on the affected arm. If possible, the shoulder should be directly over the hand for maximum pressure. If the patient is unable to get on the floor, the program can be adpted by placing the board on a table. The average home program begins with 3 minute sessions of steady scrubbing performed three times a day. A record sheet is given to the patient to take home and record the duration of each session.  Carry:  The patient is told to carry a briefcase or purse in the affected hand, with the arm extended. The amount of weight is the maximum tolerated by the patient. An initial weight is determined at the first session, generally ranging from 1 to 5 pounds. The weight should be carried throughout the day whenever the patient is walking or standing. The amount of weight carried is recorded daily on the record sheet.  Generally, no  other treatment modalities are used initially. The stress loading program is the only home program given and the only treatment modality used in supervised therapy sessions until the pain begins to subside. This allows the patient to concentrate their efforts on the program and avoids posible increase in severity of symptoms from other modalities of treatment.  A followup vist is usually scheduled within a day or two of starting treatment. The frequency of followup visits is dependent on the patient's need for reinforcement, supervision, and treatment modification. The average patient is aware of improvement of symptoms in 5 days. It is usually necessary to repeat initial explanations and instructions. The program is advanced as follows:  Scrub:  After the first several days, the program is increased to 5 minutes three times a day and generally increased to 7 minutes after two weeks. If tolerated, an alternative is a 10 minute session twice a day. The physician or therapist should have the patient go through the program during each session to make sure that adequate force is being applied and to judge the patient's ability to increase the duration of each session.  Carry:  Maximum weight carried at home is compared with the patient's demonstrated ability in the office. Recommended increases in weight are made. The patient is also encouraged to continue increasing the weight between sessions if possible.

## 2015-03-05 ENCOUNTER — Ambulatory Visit (HOSPITAL_COMMUNITY): Payer: Medicaid Other | Admitting: Specialist

## 2015-03-05 DIAGNOSIS — M6281 Muscle weakness (generalized): Secondary | ICD-10-CM

## 2015-03-05 DIAGNOSIS — M25611 Stiffness of right shoulder, not elsewhere classified: Secondary | ICD-10-CM

## 2015-03-05 DIAGNOSIS — M79601 Pain in right arm: Secondary | ICD-10-CM

## 2015-03-05 DIAGNOSIS — R29898 Other symptoms and signs involving the musculoskeletal system: Secondary | ICD-10-CM

## 2015-03-05 NOTE — Therapy (Signed)
[place] [facility] [address] [place], [place], [address] Phone: [phone]   Fax:  [phone]  Occupational Therapy Treatment  Patient Details  Name: Megan Holloway MRN: [id number] Date of Birth: 07/07/1960 Referring Provider: Dr. Brendia Sacks  Encounter Date: 03/05/2015      OT End of Session - 03/05/15 1422    Visit Number 9   Number of Visits 12   Date for OT Re-Evaluation 03/27/15   Authorization Type Self Pay. Patient is working on Health visitor and Disability. Informed patient of approximately how much each session would be.   OT Start Time 1300   OT Stop Time 1340   OT Time Calculation (min) 40 min   Activity Tolerance Patient tolerated treatment well   Behavior During Therapy WFL for tasks assessed/performed      Past Medical History  Diagnosis Date  . Vertigo   . Dyslipidemia 01/05/2015    Past Surgical History  Procedure Laterality Date  . Uterine fibroid surgery    . Cesarean section      There were no vitals filed for this visit.  Visit Diagnosis:  Decreased grip strength of right hand  Arm pain, right  Stiffness of shoulder joint, right  Muscle weakness of right upper extremity      Subjective Assessment - 03/05/15 1420    Subjective  S:  I can't lift my right arm up without using my left arm to help it.   Currently in Pain? Yes   Pain Score 8    Pain Location Shoulder   Pain Orientation Right   Pain Descriptors / Indicators Aching   Pain Type Acute pain   Pain Radiating Towards hand   Pain Onset More than a month ago   Pain Frequency Constant   Aggravating Factors  movement, touching arm   Pain Relieving Factors none   Effect of Pain on Daily Activities limited use of RUE during daily tasks             [facility] OT Assessment - 03/05/15 0001    Assessment   Diagnosis Right side weakness                  OT Treatments/Exercises (OP) - 03/05/15 0001    ADLs   Home  Maintenance Patient prepared spaghetti and sauce this date, retreiving items from low cabinets and drawers independently.  Patient used left hand for majority of tasks initially and was able to improve use of her right arm with cuing from therapist.  Patient improved volitional use from 10% to 25% by end of task.  Patient able to sequence steps of making pasta and washing dishes etc independently this date and used [facility] safety skills throughtout the session.     Shoulder Exercises: Stretch   Table Stretch - Flexion 3 reps                OT Education - 03/05/15 1421    Education provided Yes   Education Details use of right arm at home with functional activities    Person(s) Educated Patient   Methods Explanation;Demonstration;Verbal cues   Comprehension Verbal cues required;Need further instruction          OT Short Term Goals - 02/02/15 1521    OT SHORT TERM GOAL #1   Title Patient will be educated and independent with HEP allowing for right UE to return as dominant extremity.    Time 3   Period Weeks   Status  On-going   OT SHORT TERM GOAL #2   Title Patient will increase coordination in Right hand by decreasing 9 hole peg test time by 20 seconds.    Time 3   Period Weeks   Status On-going   OT SHORT TERM GOAL #3   Title Patient will increase grip strength by 10# and pinch strength by 5# to increase ability to hold onto items without dropping them.    Time 3   Period Weeks   Status On-going   OT SHORT TERM GOAL #4   Title Patient will increase RUE strength to 4/5 to increase ability to return to normal work activities.    Time 3   Period Weeks   Status On-going   OT SHORT TERM GOAL #5   Title Patient will report a decrease in pain level in RUE to 4/10 or less during daily tasks.    Time 3   Period Weeks   Status On-going   Additional Short Term Goals   Additional Short Term Goals Yes   OT SHORT TERM GOAL #6   Title Patient will increase P/ROM of RUE to [facility] to  increase ability to use arm for functional tasks.    Time 3   Period Weeks   Status New           OT Long Term Goals - 02/02/15 1521    OT LONG TERM GOAL #1   Title Patient will return to highest level of independence with all daily tasks using RUE as dominant extremity.    Time 6   Period Weeks   Status On-going   OT LONG TERM GOAL #2   Title Patient will increase coordination by decreasing 9 hole peg test time by 20 seconds allowing for an increased ability to do buttons and zippers with RUE.    Time 6   Period Weeks   Status On-going   OT LONG TERM GOAL #3   Title Patient will increase RUE strength to 4+/5 to increase ability to complete work related tasks using RUE.   Time 6   Period Weeks   Status On-going   OT LONG TERM GOAL #4   Title Patient will decrease pain level in RUE to 2/10 or less when completing daily tasks.    Time 6   Period Weeks   Status On-going   OT LONG TERM GOAL #5   Title Patient will increase grip strength by 15# and pinch strength by 10# to increase ability to hold on items and comb hair with more independence.    Time 6   Period Weeks   Status On-going   Long Term Additional Goals   Additional Long Term Goals Yes   OT LONG TERM GOAL #6   Title Patient will increase A/ROM of RUE to [facility] to increase ability to complete tasks at shoulder height.               Plan - 03/05/15 1422    Clinical Impression Statement A:  [name] provided spaghetti and sauce this date, using her right hand and arm to assist the left with the task.  Patient initially required max vg to use her right arm, and would only do so with her upper arm affixed to her side.  throughout treatment, [name] began using her right arm more, reaching to place pan on back burner, washing a dish, and spooning sauce and spaghetti.  DIscussed session at conclusion.  Patient stated she was surprised that she  could cook for herself and that she just cant use her right arm to  reach.  As she stated this she was able to reach with her right arm to demonstrate what she could not do.  Therapist educated her at length on the importance of using her right arm with functional activities, despite the pain in order to improve functional use and prevent further increase in pain and tightness from occurring.  Patient stated she would cook except she was afraid of knifes (we addressed knife use 2 sessions ago, and patient was able to use safely and independently).  DIscussed at length purchasing prep items that were already cut in order to improve independence and confidence with cooking at home.    Plan P:  Reassess for MD visit on 03/12/15.  Identify pre prepped items for purchase at the grocer for 4 meals that patient enjoys cooking (work on together), improve ability to reach to shoulder height with right arm.         Problem List Patient Active Problem List   Diagnosis Date Noted  . Dyslipidemia 01/05/2015  . CVA (cerebral infarction) 01/03/2015    Shirlean Mylar, OTR/L [phone]  03/05/2015, 2:34 PM  [facility] [address] [place], [place], [address] Phone: [phone]   Fax:  [phone]  Name: Megan Holloway MRN: [id number] Date of Birth: March 13, 1960

## 2015-03-07 ENCOUNTER — Ambulatory Visit (HOSPITAL_COMMUNITY): Payer: Medicaid Other | Attending: Family Medicine | Admitting: Occupational Therapy

## 2015-03-07 ENCOUNTER — Encounter (HOSPITAL_COMMUNITY): Payer: Self-pay | Admitting: Occupational Therapy

## 2015-03-07 DIAGNOSIS — R29898 Other symptoms and signs involving the musculoskeletal system: Secondary | ICD-10-CM

## 2015-03-07 DIAGNOSIS — M25611 Stiffness of right shoulder, not elsewhere classified: Secondary | ICD-10-CM | POA: Insufficient documentation

## 2015-03-07 DIAGNOSIS — M6281 Muscle weakness (generalized): Secondary | ICD-10-CM | POA: Insufficient documentation

## 2015-03-07 DIAGNOSIS — R279 Unspecified lack of coordination: Secondary | ICD-10-CM | POA: Insufficient documentation

## 2015-03-07 DIAGNOSIS — M79601 Pain in right arm: Secondary | ICD-10-CM | POA: Diagnosis present

## 2015-03-07 NOTE — Therapy (Addendum)
[place], [place], [address] Phone: [phone]   Fax:  [phone]  Occupational Therapy Reassessment, Treatment, and Discharge Summary  Patient Details  Name: Megan Holloway MRN: [id number] Date of Birth: 1960/01/20 Referring Provider: Dr. Murray Hodgkins  Encounter Date: 03/07/2015      OT End of Session - 03/07/15 1405    Visit Number 10   Number of Visits 12   Date for OT Re-Evaluation 03/27/15   Authorization Type Self Pay. Patient is working on Engineer, mining and Disability. Informed patient of approximately how much each session would be.   OT Start Time 1300   OT Stop Time 1345   OT Time Calculation (min) 45 min   Activity Tolerance Patient tolerated treatment well   Behavior During Therapy WFL for tasks assessed/performed      Past Medical History  Diagnosis Date  . Vertigo   . Dyslipidemia 01/05/2015    Past Surgical History  Procedure Laterality Date  . Uterine fibroid surgery    . Cesarean section      There were no vitals filed for this visit.  Visit Diagnosis:  Decreased grip strength of right hand  Arm pain, right  Stiffness of shoulder joint, right  Muscle weakness of right upper extremity  Lack of coordination      Subjective Assessment - 03/07/15 1400    Subjective  S: I just can't use this arm.    Currently in Pain? Yes   Pain Score 8    Pain Location Shoulder   Pain Orientation Right   Pain Descriptors / Indicators Aching   Pain Type Acute pain   Pain Radiating Towards to hand   Pain Onset More than a month ago   Pain Frequency Constant   Aggravating Factors  movement, touching arm   Pain Relieving Factors non   Effect of Pain on Daily Activities limited use of RUE during daily tasks.   Multiple Pain Sites No           [facility] OT Assessment - 03/07/15 1300    Assessment   Diagnosis Right side weakness   Precautions   Precautions Other (comment)    Precaution Comments Decreased sensation in right Hand   Sensation   Light Touch Impaired Detail  same as eval   Light Touch Impaired Details Impaired RUE  same as eval   Semmes Weinstein Monofilament Scale Unresponsive  only sensation 5th digit fingertip to wrist   Stereognosis Impaired by gross assessment  same as eval   Hot/Cold Impaired by gross assessment  same as eval   Proprioception Appears Intact  same as eval   Coordination   Right 9 Hole Peg Test 2'15"  previous 1'17"   ROM / Strength   AROM / PROM / Strength AROM;Strength;PROM   AROM   Overall AROM Comments Assessed seated. IR/er Adducted   AROM Assessment Site Shoulder   Right/Left Shoulder Right   Right Shoulder Flexion 90 Degrees  108 previous   Right Shoulder ABduction 85 Degrees  98 previous   Right Shoulder Internal Rotation 90 Degrees  same as previous   Right Shoulder External Rotation 65 Degrees  48 previous   PROM   Overall PROM Comments Assessed supine, pt resisting OT passively ranging arm even with consistent verbal cuing   PROM Assessment Site Shoulder   Right/Left Shoulder Right   Right Shoulder Flexion 92 Degrees   Right Shoulder ABduction 80 Degrees   Right Shoulder Internal  Rotation 90 Degrees   Right Shoulder External Rotation 35 Degrees   Strength   Overall Strength Comments Assessed seated.   Strength Assessment Site Shoulder   Right/Left Shoulder Right   Right Shoulder Flexion 3/5  same as previous   Right Shoulder ABduction 3/5  same as previous   Right Shoulder Internal Rotation 3/5  same as previous   Right Shoulder External Rotation 3/5  same as previous   Right Shoulder Horizontal ABduction 3/5  same as previous   Right Shoulder Horizontal ADduction 3/5  same as previous   Right/Left hand Right   Right Hand Grip (lbs) 15  20 previous   Right Hand Lateral Pinch 3 lbs  2 previous   Right Hand 3 Point Pinch 2 lbs  4 previous                  OT  Treatments/Exercises (OP) - 03/07/15 1401    Exercises   Exercises Hand;Shoulder;Neurological Re-education   Shoulder Exercises: Supine   Protraction AAROM  3 reps   Protraction Limitations Pt displays very slow and labored movements, requiring significantly increased time to complete 3 repetitions   Functional Reaching Activities   Mid Level Pinch tree-pt placed 5 clothespins along vertical pole of pinch tree, requiring consistent cuing to allow flexion of shoulder and completion of reaching task. Pt tries to keep elbow tucked into her side and moves whole body versus soley her RUE.    Manual Therapy   Manual Therapy Myofascial release   Manual therapy comments manual therapy completed separately to all exercises and activities   Myofascial Release Myofascial release to right upper arm, trapezius, and scapularis regions to decrease fascial restrictions and pain, and increase joint range of motion                 OT Short Term Goals - 02/02/15 1521    OT SHORT TERM GOAL #1   Title Patient will be educated and independent with HEP allowing for right UE to return as dominant extremity.    Time 3   Period Weeks   Status On-going   OT SHORT TERM GOAL #2   Title Patient will increase coordination in Right hand by decreasing 9 hole peg test time by 20 seconds.    Time 3   Period Weeks   Status On-going   OT SHORT TERM GOAL #3   Title Patient will increase grip strength by 10# and pinch strength by 5# to increase ability to hold onto items without dropping them.    Time 3   Period Weeks   Status On-going   OT SHORT TERM GOAL #4   Title Patient will increase RUE strength to 4/5 to increase ability to return to normal work activities.    Time 3   Period Weeks   Status On-going   OT SHORT TERM GOAL #5   Title Patient will report a decrease in pain level in RUE to 4/10 or less during daily tasks.    Time 3   Period Weeks   Status On-going   Additional Short Term Goals    Additional Short Term Goals Yes   OT SHORT TERM GOAL #6   Title Patient will increase P/ROM of RUE to [facility] to increase ability to use arm for functional tasks.    Time 3   Period Weeks   Status New           OT Long Term Goals - 02/02/15 1521  OT LONG TERM GOAL #1   Title Patient will return to highest level of independence with all daily tasks using RUE as dominant extremity.    Time 6   Period Weeks   Status On-going   OT LONG TERM GOAL #2   Title Patient will increase coordination by decreasing 9 hole peg test time by 20 seconds allowing for an increased ability to do buttons and zippers with RUE.    Time 6   Period Weeks   Status On-going   OT LONG TERM GOAL #3   Title Patient will increase RUE strength to 4+/5 to increase ability to complete work related tasks using RUE.   Time 6   Period Weeks   Status On-going   OT LONG TERM GOAL #4   Title Patient will decrease pain level in RUE to 2/10 or less when completing daily tasks.    Time 6   Period Weeks   Status On-going   OT LONG TERM GOAL #5   Title Patient will increase grip strength by 15# and pinch strength by 10# to increase ability to hold on items and comb hair with more independence.    Time 6   Period Weeks   Status On-going   Long Term Additional Goals   Additional Long Term Goals Yes   OT LONG TERM GOAL #6   Title Patient will increase A/ROM of RUE to [facility] to increase ability to complete tasks at shoulder height.               Plan - 03/07/15 1405    Clinical Impression Statement A: Pt reassessed today due to impending MD appts on Monday 03/12/15. Pt has not met any goals, and measurements have declined since evaluation on 01/26/15. Pt is very reluctant to use her RUE during OT session, even with OT direction and assistance. However, upon observations pt does use her RUE of her own volition when in waiting room, pulling out chairs, etc. When cued pt is able to use her RUE functionally, however displays  slow and labored movements and taking significantly increased time during tasks. At this point OT staff feel that we have exhausted our resources regarding treatment for pt, and recommend a neuropyschological evaluation as Ms. Wakeman is able to use her arm however refuses to acknowledge/use this ability.  Pt reports she completes HEP at home, however noncompliance is suspected.    Plan P: Hold pt therapy until after MD appt on 3/6 & determine appropriateness of impending discharge.         Problem List Patient Active Problem List   Diagnosis Date Noted  . Dyslipidemia 01/05/2015  . CVA (cerebral infarction) 01/03/2015    Guadelupe Sabin, OTR/L  [phone]  03/07/2015, 2:16 PM  [place], [place], [address] Phone: [phone]   Fax:  [phone]  Name: Megan Holloway MRN: [id number] Date of Birth: 1960-12-28   03/09/2015 OCCUPATIONAL THERAPY DISCHARGE SUMMARY  Visits from Start of Care: 10  Current functional level related to goals / functional outcomes: See above. Pt has not made progress during OT therapy sessions, although is able to functionally use her arm when cued and reminded to incorporate the arm. Ms. Kees has been observed completing functional activities such as holding items in her right hand, pulling out chairs, shifting body weight using BUE, operating doors/doorhandles, and utilizing a cell phone with the RUE with minimal difficulty.    Remaining deficits: Pt continues to  report increased pain and decreased strength, range of motion, and coordination, as well as overall decreased functional use of the RUE.    Education / Equipment: Pt has been provided with exercises and instructions for increasing functional use of the RUE, as well as educated on pain management post-stroke.  Plan: Patient agrees to discharge.  Patient goals were not met. Patient is being discharged due to the patient's  request.  ?????     Pt contacted [facility] on 03/08/2015 and requested to be discharged from OT services, as she will be seeking occupational therapy services elsewhere.

## 2015-03-09 ENCOUNTER — Telehealth (HOSPITAL_COMMUNITY): Payer: Self-pay

## 2015-03-09 NOTE — Telephone Encounter (Signed)
Date: 03/08/15  Patient called very upset about what was said about her using her right arm to use the cell phone and push the clinic door open. I tried to explain to her why we document what we document. She did not understand what a cell phone has to do with her arm. I explained to her about coordination versus strength. 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 This conversation went in a circle for a very long time.   She mentioned that her Doctor asked why we were worried about her using her phone. That we should be working on strength not using her phone. Again I explained to her that it wasn't the object that we're looking at but the COORDINATION of the hand.   In the end she said she would be going to a different therapist and then hung up.

## 2015-03-17 ENCOUNTER — Emergency Department (HOSPITAL_COMMUNITY)
Admission: EM | Admit: 2015-03-17 | Discharge: 2015-03-17 | Disposition: A | Discharge status: Home / Self Care | Payer: Medicaid Other | Attending: Emergency Medicine | Admitting: Emergency Medicine

## 2015-03-17 ENCOUNTER — Encounter (HOSPITAL_COMMUNITY): Payer: Self-pay | Admitting: Emergency Medicine

## 2015-03-17 ENCOUNTER — Emergency Department (HOSPITAL_COMMUNITY): Payer: Medicaid Other

## 2015-03-17 DIAGNOSIS — E785 Hyperlipidemia, unspecified: Secondary | ICD-10-CM | POA: Diagnosis not present

## 2015-03-17 DIAGNOSIS — Z7902 Long term (current) use of antithrombotics/antiplatelets: Secondary | ICD-10-CM | POA: Diagnosis not present

## 2015-03-17 DIAGNOSIS — Z79899 Other long term (current) drug therapy: Secondary | ICD-10-CM | POA: Diagnosis not present

## 2015-03-17 DIAGNOSIS — M25511 Pain in right shoulder: Secondary | ICD-10-CM | POA: Diagnosis present

## 2015-03-17 DIAGNOSIS — Z8673 Personal history of transient ischemic attack (TIA), and cerebral infarction without residual deficits: Secondary | ICD-10-CM | POA: Insufficient documentation

## 2015-03-17 DIAGNOSIS — Z7982 Long term (current) use of aspirin: Secondary | ICD-10-CM | POA: Insufficient documentation

## 2015-03-17 HISTORY — DX: Cerebral infarction, unspecified: I63.9

## 2015-03-17 MED ORDER — HYDROCODONE-ACETAMINOPHEN 5-325 MG PO TABS: 2.0000 | ORAL_TABLET | ORAL | Status: DC | PRN

## 2015-03-17 MED ORDER — HYDROCODONE-ACETAMINOPHEN 5-325 MG PO TABS: 1.0000 | ORAL_TABLET | ORAL | Status: DC | PRN

## 2015-03-17 MED ORDER — CYCLOBENZAPRINE HCL 10 MG PO TABS
10.0000 mg | ORAL_TABLET | Freq: Once | ORAL | Status: AC
  Administered 2015-03-17: 10 mg via ORAL
  Filled 2015-03-17: qty 1

## 2015-03-17 MED ORDER — CYCLOBENZAPRINE HCL 5 MG PO TABS: 5.0000 mg | ORAL_TABLET | Freq: Three times a day (TID) | ORAL | Status: AC | PRN

## 2015-03-17 NOTE — ED Notes (Signed)
Patient complaining of right shoulder pain starting 2 days ago. States "Ever since Thursday I haven't been able to left my right arm up because my right shoulder hurts so bad. I've been doing rehab on that arm and I've been telling her my right shoulder hurts." States last rehab visit was one week ago.

## 2015-03-17 NOTE — Discharge Instructions (Signed)
Take the medication as directed. The medication may make you sleepy. Follow up with Dr. Romeo AppleHarrison, the bone doctor, for further evaluation. Return here as needed.

## 2015-03-17 NOTE — ED Provider Notes (Signed)
CSN: [id number]     Arrival date & time 03/17/15  1629 History   First MD Initiated Contact with Patient 03/17/15 1703     Chief Complaint  Patient presents with  . Shoulder Pain     (Consider location/radiation/quality/duration/timing/severity/associated sxs/prior Treatment) Patient is a 55 y.o. female presenting with shoulder pain. The history is provided by the patient.  Shoulder Pain Location:  Shoulder Time since incident:  2 days Injury: no   Shoulder location:  R shoulder Pain details:    Quality:  Aching   Radiates to:  R arm   Onset quality:  Unable to specify   Timing:  Constant   Progression:  Worsening Chronicity:  New Handedness:  Right-handed Dislocation: no   Foreign body present:  No foreign bodies Prior injury to area:  No Relieved by:  Nothing Worsened by:  Movement Ineffective treatments:  Acetaminophen (tramadol) Associated symptoms: decreased range of motion    Lesslie Mossa is a 55 y.o. female who presents to the ED with right shoulder pain that started 2 days ago. She reports that she had a stroke 01/03/15 and has been in PT since then. She told them about the shoulder pain and they told her they were only to work on her hand and needed another order to do any PT on her shoulder. The patient reports aching and decreased range of motion to the right shoulder. She has been taking tylenol and tramadol without relief.  Past Medical History  Diagnosis Date  . Vertigo   . Dyslipidemia 01/05/2015  . Stroke [facility])    Past Surgical History  Procedure Laterality Date  . Uterine fibroid surgery    . Cesarean section     Family History  Problem Relation Age of Onset  . Stroke Mother   . Cancer Father   . Stroke Brother    Social History  Substance Use Topics  . Smoking status: Never Smoker   . Smokeless tobacco: None  . Alcohol Use: No   OB History    No data available     Review of Systems  Musculoskeletal: Positive for arthralgias.        Right shoulder pain Right side weakness due to CVA  all other systems negative    Allergies  Fluconazole; Sulfa antibiotics; and Penicillins  Home Medications   Prior to Admission medications   Medication Sig Start Date End Date Taking? Authorizing Provider  acetaminophen (TYLENOL) 500 MG tablet Take 500 mg by mouth every 6 (six) hours as needed for mild pain.   Yes Historical Provider, MD  aspirin 325 MG tablet Take 1 tablet (325 mg total) by mouth daily. 01/05/15  Yes Standley Brooking, MD  clopidogrel (PLAVIX) 75 MG tablet Take 1 tablet (75 mg total) by mouth daily. 01/05/15  Yes Standley Brooking, MD  lisinopril (PRINIVIL,ZESTRIL) 2.5 MG tablet Take 2.5 mg by mouth daily. 03/12/15 03/11/16 Yes Historical Provider, MD  meclizine (ANTIVERT) 12.5 MG tablet Take 12.5 mg by mouth daily as needed for dizziness.   Yes Historical Provider, MD  atorvastatin (LIPITOR) 80 MG tablet Take 1 tablet (80 mg total) by mouth daily at 6 PM. 01/05/15   Standley Brooking, MD  clotrimazole (LOTRIMIN) 1 % cream Apply 1 application topically 2 (two) times daily as needed (Rash). Reported on 01/26/2015    Historical Provider, MD  cyclobenzaprine (FLEXERIL) 5 MG tablet Take 1 tablet (5 mg total) by mouth 3 (three) times daily as needed for muscle spasms. 03/17/15  Yocelyn Brocious Orlene OchM Cortlandt Capuano, NP  HYDROcodone-acetaminophen (NORCO/VICODIN) 5-325 MG tablet Take 1 tablet by mouth every 4 (four) hours as needed. 03/17/15   Amanda Steuart Orlene OchM Linkoln Alkire, NP  HYDROcodone-acetaminophen (NORCO/VICODIN) 5-325 MG tablet Take 2 tablets by mouth every 4 (four) hours as needed. 03/17/15   Bernestine Holsapple Orlene OchM Florian Chauca, NP  montelukast (SINGULAIR) 10 MG tablet Take 1 tablet (10 mg total) by mouth every other day. Patient taking differently: Take 10 mg by mouth daily as needed (Alllergies).  01/05/15   Standley Brookinganiel P Goodrich, MD   BP 150/71 mmHg  Pulse 71  Temp(Src) 97.8 F (36.6 C) (Oral)  Resp 15  Ht 5\' 5"  (1.651 m)  Wt 86.183 kg  BMI 31.62 kg/m2  SpO2 100% Physical Exam   Constitutional: She is oriented to person, place, and time. She appears well-developed and well-nourished. No distress.  HENT:  Head: Normocephalic and atraumatic.  Eyes: EOM are normal.  Neck: Neck supple.  Cardiovascular: Normal rate and regular rhythm.   Pulmonary/Chest: Effort normal and breath sounds normal.  Abdominal: Soft. There is no tenderness.  Musculoskeletal:       Right shoulder: She exhibits decreased range of motion, tenderness, pain and decreased strength (due to CVA). She exhibits no crepitus, no deformity, no laceration and normal pulse.  Neurological: She is alert and oriented to person, place, and time. No cranial nerve deficit.  Skin: Skin is warm and dry.  Psychiatric: She has a normal mood and affect. Her behavior is normal.  Nursing note and vitals reviewed.   ED Course  Procedures (including critical care time) X-ray, flexeril, ice  Labs Review Labs Reviewed - No data to display  Imaging Review Dg Shoulder Right  03/17/2015  CLINICAL DATA:  Right shoulder pain, no known injury EXAM: RIGHT SHOULDER - 2+ VIEW COMPARISON:  None. FINDINGS: No fracture or dislocation is seen. The joint spaces are preserved. Visualized soft tissues are within normal limits. Visualized right lung is clear. IMPRESSION: No fracture or dislocation is seen. Electronically Signed   By: Charline BillsSriyesh  Krishnan M.D.   On: 03/17/2015 17:40    MDM  55 y.o. female with right shoulder pain stable for d/c with pain improvement after flexeril. She will f/u with her PCP or with ortho to discuss PT or additional imaging if pain persists. She will return here as needed.   Final diagnoses:  Right shoulder pain       Janne NapoleonHope M Devan Danzer, NP 03/17/15 [id number]  Benjiman CoreNathan Pickering, MD 03/18/15 2210

## 2015-03-17 NOTE — ED Notes (Signed)
6 pack of Norco given to patient with instructions given. Verbalizes understanding.

## 2015-03-19 MED FILL — Hydrocodone-Acetaminophen Tab 5-325 MG: ORAL | Qty: 6 | Status: AC

## 2015-09-03 ENCOUNTER — Encounter (HOSPITAL_COMMUNITY): Payer: Self-pay | Admitting: Emergency Medicine

## 2015-09-03 ENCOUNTER — Emergency Department (HOSPITAL_COMMUNITY)
Admission: EM | Admit: 2015-09-03 | Discharge: 2015-09-03 | Disposition: A | Discharge status: Home / Self Care | Payer: Medicaid Other | Attending: Emergency Medicine | Admitting: Emergency Medicine

## 2015-09-03 DIAGNOSIS — Z79899 Other long term (current) drug therapy: Secondary | ICD-10-CM | POA: Insufficient documentation

## 2015-09-03 DIAGNOSIS — M25511 Pain in right shoulder: Secondary | ICD-10-CM | POA: Diagnosis not present

## 2015-09-03 DIAGNOSIS — G8929 Other chronic pain: Secondary | ICD-10-CM | POA: Insufficient documentation

## 2015-09-03 DIAGNOSIS — J329 Chronic sinusitis, unspecified: Secondary | ICD-10-CM | POA: Insufficient documentation

## 2015-09-03 DIAGNOSIS — Z7982 Long term (current) use of aspirin: Secondary | ICD-10-CM | POA: Diagnosis not present

## 2015-09-03 DIAGNOSIS — M25519 Pain in unspecified shoulder: Secondary | ICD-10-CM

## 2015-09-03 DIAGNOSIS — J029 Acute pharyngitis, unspecified: Secondary | ICD-10-CM | POA: Diagnosis present

## 2015-09-03 MED ORDER — DEXAMETHASONE 4 MG PO TABS: 4.0000 mg | ORAL_TABLET | Freq: Two times a day (BID) | ORAL | 0 refills | Status: DC

## 2015-09-03 MED ORDER — PREDNISONE 20 MG PO TABS
40.0000 mg | ORAL_TABLET | Freq: Once | ORAL | Status: AC
  Administered 2015-09-03: 40 mg via ORAL
  Filled 2015-09-03: qty 2

## 2015-09-03 MED ORDER — AZITHROMYCIN 250 MG PO TABS
500.0000 mg | ORAL_TABLET | Freq: Once | ORAL | Status: AC
  Administered 2015-09-03: 500 mg via ORAL
  Filled 2015-09-03: qty 2

## 2015-09-03 MED ORDER — OXYMETAZOLINE HCL 0.05 % NA SOLN
1.0000 | Freq: Once | NASAL | Status: AC
  Administered 2015-09-03: 1 via NASAL
  Filled 2015-09-03: qty 15

## 2015-09-03 MED ORDER — ONDANSETRON HCL 4 MG PO TABS
4.0000 mg | ORAL_TABLET | Freq: Once | ORAL | Status: AC
  Administered 2015-09-03: 4 mg via ORAL
  Filled 2015-09-03: qty 1

## 2015-09-03 MED ORDER — AZITHROMYCIN 250 MG PO TABS: ORAL_TABLET | ORAL | 0 refills | Status: DC

## 2015-09-03 NOTE — Discharge Instructions (Signed)
His please use 2 sprays of Afrin to each nostril every 8 hours for 5 days only. Please use Decadron 2 times daily with food. Use Zithromax daily until all taken. Please see your primary physician for additional evaluation if not improving. Please see your primary physician concerning your chronic shoulder pain.

## 2015-09-03 NOTE — ED Triage Notes (Signed)
Pt reports right shoulder pain x2 weeks, pt has had stroke affecting this side, no injury.  Pt reports she has intermittent pain in this shoulder since stroke in December.  Pt also reports sinus congestion x2 days with head pressure, runny nose.  Pt alert and oriented.

## 2015-09-03 NOTE — ED Notes (Signed)
Patient verbalizes understanding of discharge instructions, prescriptions, home care and follow up care. Patient out of department at this time. 

## 2015-09-03 NOTE — ED Provider Notes (Signed)
[facility] DEPT Provider Note   CSN: [id number] Arrival date & time: 09/03/15  1748   By signing my name below, I, Christel MormonMatthew Jamison, attest that this documentation has been prepared under the direction and in the presence of Ivery QualeHobson Daltyn Degroat, PA-C. Electronically Signed: Christel MormonMatthew Jamison, Scribe. 09/03/2015. 6:59 PM.   History   Chief Complaint Chief Complaint  Patient presents with  . Sinus Problem    The history is provided by the patient. No language interpreter was used.   HPI Comments:  Megan Holloway is a 55 y.o. female who presents to the Emergency Department complaining of sudden onset, constant headaches and congestion x2 days. Pt complain of associated fever, sore throat. Pt notes taking Tylenol for fever without relief. Pt denies any blood in mucus.   PCP: BLESSING, MARY, NP  Past Medical History:  Diagnosis Date  . Dyslipidemia 01/05/2015  . Stroke ([facility])   . Vertigo     Patient Active Problem List   Diagnosis Date Noted  . Dyslipidemia 01/05/2015  . CVA (cerebral infarction) 01/03/2015    Past Surgical History:  Procedure Laterality Date  . CESAREAN SECTION    . UTERINE FIBROID SURGERY      OB History    No data available       Home Medications    Prior to Admission medications   Medication Sig Start Date End Date Taking? Authorizing Provider  acetaminophen (TYLENOL) 500 MG tablet Take 500 mg by mouth every 6 (six) hours as needed for mild pain.    Historical Provider, MD  aspirin 325 MG tablet Take 1 tablet (325 mg total) by mouth daily. 01/05/15   Standley Brookinganiel P Goodrich, MD  atorvastatin (LIPITOR) 80 MG tablet Take 1 tablet (80 mg total) by mouth daily at 6 PM. 01/05/15   Standley Brookinganiel P Goodrich, MD  clopidogrel (PLAVIX) 75 MG tablet Take 1 tablet (75 mg total) by mouth daily. 01/05/15   Standley Brookinganiel P Goodrich, MD  clotrimazole (LOTRIMIN) 1 % cream Apply 1 application topically 2 (two) times daily as needed (Rash). Reported on 01/26/2015    Historical Provider, MD   cyclobenzaprine (FLEXERIL) 5 MG tablet Take 1 tablet (5 mg total) by mouth 3 (three) times daily as needed for muscle spasms. 03/17/15   Hope Orlene OchM Neese, NP  HYDROcodone-acetaminophen (NORCO/VICODIN) 5-325 MG tablet Take 1 tablet by mouth every 4 (four) hours as needed. 03/17/15   Hope Orlene OchM Neese, NP  HYDROcodone-acetaminophen (NORCO/VICODIN) 5-325 MG tablet Take 2 tablets by mouth every 4 (four) hours as needed. 03/17/15   Hope Orlene OchM Neese, NP  lisinopril (PRINIVIL,ZESTRIL) 2.5 MG tablet Take 2.5 mg by mouth daily. 03/12/15 03/11/16  Historical Provider, MD  meclizine (ANTIVERT) 12.5 MG tablet Take 12.5 mg by mouth daily as needed for dizziness.    Historical Provider, MD  montelukast (SINGULAIR) 10 MG tablet Take 1 tablet (10 mg total) by mouth every other day. Patient taking differently: Take 10 mg by mouth daily as needed (Alllergies).  01/05/15   Standley Brookinganiel P Goodrich, MD    Family History Family History  Problem Relation Age of Onset  . Stroke Mother   . Cancer Father   . Stroke Brother     Social History Social History  Substance Use Topics  . Smoking status: Never Smoker  . Smokeless tobacco: Not on file  . Alcohol use No     Allergies   Fluconazole; Sulfa antibiotics; and Penicillins   Review of Systems Review of Systems  Constitutional: Positive for fever.  HENT:  Positive for congestion and sore throat.   Neurological: Positive for headaches.  All other systems reviewed and are negative.    Physical Exam Updated Vital Signs BP 141/71   Pulse 73   Temp 98.6 F (37 C) (Oral)   Resp 16   Ht 5\' 5"  (1.651 m)   Wt 190 lb (86.2 kg)   SpO2 100%   BMI 31.62 kg/m   Physical Exam  Constitutional: She appears well-developed and well-nourished. No distress.  HENT:  Head: Normocephalic and atraumatic.  Normal ear.  Posterior pharynx normal.  Airway patent.  Nasal congestion present.    Eyes: Conjunctivae are normal.  Cardiovascular: Normal rate, regular rhythm and normal heart  sounds.   Pulmonary/Chest: Effort normal and breath sounds normal.  Abdominal: She exhibits no distension.  Neurological: She is alert.  Skin: Skin is warm and dry.  No rash of palms, no rash inside mouth.  Psychiatric: She has a normal mood and affect.  Nursing note and vitals reviewed.    ED Treatments / Results  DIAGNOSTIC STUDIES:  Oxygen Saturation is 100% on RA, normal by my interpretation.    COORDINATION OF CARE:  6:59 PM Discussed treatment plan with pt at bedside and pt agreed to plan.  Labs (all labs ordered are listed, but only abnormal results are displayed) Labs Reviewed - No data to display  EKG  EKG Interpretation None       Radiology No results found.  Procedures Procedures (including critical care time)  Medications Ordered in ED Medications - No data to display   Initial Impression / Assessment and Plan / ED Course  I have reviewed the triage vital signs and the nursing notes.  Pertinent labs & imaging results that were available during my care of the patient were reviewed by me and considered in my medical decision making (see chart for details).  Clinical Course    **I have reviewed nursing notes, vital signs, and all appropriate lab and imaging results for this patient.*  Final Clinical Impressions(s) / ED Diagnoses  Vital signs within normal limits. Pulse oximetry is 100% room air. The examination favors a sinusitis, and chronic shoulder pain. The patient will use Afrin, Zithromax, and Decadron. Pt to use tylenol and ibuprofen for pain and discomfort. I suggested that the patient use a heating pad to the shoulder, and to follow-up with orthopedics. Patient is in agreement with this discharge plan.    Final diagnoses:  Other sinusitis  Chronic shoulder pain, unspecified laterality    New Prescriptions New Prescriptions   No medications on file  **I personally performed the services described in this documentation, which was scribed  in my presence. The recorded information has been reviewed and is accurate.Ivery Quale, PA-C 09/13/15 1439    Bethann Berkshire, MD 09/13/15 1535

## 2016-01-03 ENCOUNTER — Emergency Department (HOSPITAL_COMMUNITY)
Admission: EM | Admit: 2016-01-03 | Discharge: 2016-01-03 | Disposition: A | Discharge status: Home / Self Care | Payer: Medicaid Other | Attending: Family Medicine | Admitting: Family Medicine

## 2016-01-03 ENCOUNTER — Encounter (HOSPITAL_COMMUNITY): Payer: Self-pay | Admitting: *Deleted

## 2016-01-03 DIAGNOSIS — Z79899 Other long term (current) drug therapy: Secondary | ICD-10-CM | POA: Insufficient documentation

## 2016-01-03 DIAGNOSIS — R42 Dizziness and giddiness: Secondary | ICD-10-CM | POA: Insufficient documentation

## 2016-01-03 DIAGNOSIS — Z7982 Long term (current) use of aspirin: Secondary | ICD-10-CM | POA: Insufficient documentation

## 2016-01-03 DIAGNOSIS — Z5321 Procedure and treatment not carried out due to patient leaving prior to being seen by health care provider: Secondary | ICD-10-CM | POA: Insufficient documentation

## 2016-01-03 NOTE — ED Triage Notes (Signed)
Pt started having dizziness 1 week ago. She went to her PCP and they placed her on meclizine. In the last 5 days she has had sinus congestion and pressure. In addition to this she is having vaginal itching and burning with urination. NAD noted.

## 2016-01-03 NOTE — ED Notes (Signed)
Pt told registration she was leaving, she did not want to wait any longer

## 2016-01-06 ENCOUNTER — Encounter (HOSPITAL_COMMUNITY): Payer: Self-pay | Admitting: Emergency Medicine

## 2016-01-06 ENCOUNTER — Emergency Department (HOSPITAL_COMMUNITY): Payer: Medicaid Other

## 2016-01-06 ENCOUNTER — Emergency Department (HOSPITAL_COMMUNITY)
Admission: EM | Admit: 2016-01-06 | Discharge: 2016-01-06 | Disposition: A | Discharge status: Home / Self Care | Payer: Medicaid Other | Attending: Emergency Medicine | Admitting: Emergency Medicine

## 2016-01-06 DIAGNOSIS — Z7982 Long term (current) use of aspirin: Secondary | ICD-10-CM | POA: Insufficient documentation

## 2016-01-06 DIAGNOSIS — N760 Acute vaginitis: Secondary | ICD-10-CM | POA: Insufficient documentation

## 2016-01-06 DIAGNOSIS — R110 Nausea: Secondary | ICD-10-CM | POA: Diagnosis not present

## 2016-01-06 DIAGNOSIS — Z79899 Other long term (current) drug therapy: Secondary | ICD-10-CM | POA: Diagnosis not present

## 2016-01-06 DIAGNOSIS — R42 Dizziness and giddiness: Secondary | ICD-10-CM | POA: Insufficient documentation

## 2016-01-06 DIAGNOSIS — N898 Other specified noninflammatory disorders of vagina: Secondary | ICD-10-CM | POA: Diagnosis present

## 2016-01-06 LAB — WET PREP, GENITAL
SPERM: NONE SEEN
Yeast Wet Prep HPF POC: NONE SEEN

## 2016-01-06 LAB — CBG MONITORING, ED: Glucose-Capillary: 175 mg/dL — ABNORMAL HIGH (ref 65–99)

## 2016-01-06 MED ORDER — METRONIDAZOLE 500 MG PO TABS: 500.0000 mg | ORAL_TABLET | Freq: Two times a day (BID) | ORAL | 0 refills | Status: DC

## 2016-01-06 MED ORDER — SODIUM CHLORIDE 0.9 % IV BOLUS (SEPSIS): 500.0000 mL | Freq: Once | INTRAVENOUS | Status: DC

## 2016-01-06 MED ORDER — LORAZEPAM 2 MG/ML IJ SOLN
0.5000 mg | Freq: Once | INTRAMUSCULAR | Status: DC
Start: 2016-01-06 — End: 2016-01-06

## 2016-01-06 MED ORDER — ONDANSETRON HCL 4 MG/2ML IJ SOLN: 4.0000 mg | Freq: Once | INTRAMUSCULAR | Status: DC

## 2016-01-06 NOTE — Discharge Instructions (Signed)
Follow up with your family md or dr. Emelda FearFerguson in 1 week

## 2016-01-06 NOTE — ED Notes (Signed)
Pt states she does not IV, blood work, or IV medications. MD Zammit notified. Requests to be evaluated for a yeast infection only.

## 2016-01-06 NOTE — ED Notes (Signed)
Pt called out requesting update, MD Zammit notified.

## 2016-01-06 NOTE — ED Provider Notes (Signed)
[facility] Provider Note   CSN: [id number] Arrival date & time: 01/06/16  1234  By signing my name below, I, Sonum Patel, attest that this documentation has been prepared under the direction and in the presence of Bethann BerkshireJoseph Ouida Abeyta, MD. Electronically Signed: Sonum Patel, Neurosurgeoncribe. 01/06/16. 1:34 PM.  History   Chief Complaint Chief Complaint  Patient presents with  . Dizziness    The history is provided by the patient. No language interpreter was used.  Dizziness  Quality:  Vertigo Severity:  Mild Duration:  1 week Timing:  Constant Progression:  Unchanged Chronicity:  Chronic Ineffective treatments:  Medication Associated symptoms: nausea   Risk factors: hx of vertigo      HPI Comments: Megan Holloway is a 55 y.o. female who presents to the Emergency Department complaining of dizziness described as room spinning that began 1 week ago. She reports a history of vertigo and was started on meclizine by her PCP. She states she filled the prescription yesterday and has had 1 dose since then without relief. She reports associated nausea.    Past Medical History:  Diagnosis Date  . Dyslipidemia 01/05/2015  . Stroke ([facility])   . Vertigo     Patient Active Problem List   Diagnosis Date Noted  . Dyslipidemia 01/05/2015  . CVA (cerebral infarction) 01/03/2015    Past Surgical History:  Procedure Laterality Date  . CESAREAN SECTION    . UTERINE FIBROID SURGERY      OB History    No data available       Home Medications    Prior to Admission medications   Medication Sig Start Date End Date Taking? Authorizing Provider  acetaminophen (TYLENOL) 500 MG tablet Take 500 mg by mouth every 6 (six) hours as needed for mild pain.    Historical Provider, MD  aspirin 325 MG tablet Take 1 tablet (325 mg total) by mouth daily. 01/05/15   Standley Brookinganiel P Goodrich, MD  atorvastatin (LIPITOR) 80 MG tablet Take 1 tablet (80 mg total) by mouth daily at 6 PM. 01/05/15   Standley Brookinganiel P Goodrich, MD   azithromycin [facility](ZITHROMAX) 250 MG tablet 1 po daily 09/03/15   Ivery QualeHobson Bryant, PA-C  clopidogrel (PLAVIX) 75 MG tablet Take 1 tablet (75 mg total) by mouth daily. 01/05/15   Standley Brookinganiel P Goodrich, MD  clotrimazole (LOTRIMIN) 1 % cream Apply 1 application topically 2 (two) times daily as needed (Rash). Reported on 01/26/2015    Historical Provider, MD  cyclobenzaprine (FLEXERIL) 5 MG tablet Take 1 tablet (5 mg total) by mouth 3 (three) times daily as needed for muscle spasms. 03/17/15   Hope Orlene OchM Neese, NP  dexamethasone (DECADRON) 4 MG tablet Take 1 tablet (4 mg total) by mouth 2 (two) times daily with a meal. 09/03/15   Ivery QualeHobson Bryant, PA-C  HYDROcodone-acetaminophen (NORCO/VICODIN) 5-325 MG tablet Take 1 tablet by mouth every 4 (four) hours as needed. 03/17/15   Hope Orlene OchM Neese, NP  HYDROcodone-acetaminophen (NORCO/VICODIN) 5-325 MG tablet Take 2 tablets by mouth every 4 (four) hours as needed. 03/17/15   Hope Orlene OchM Neese, NP  lisinopril (PRINIVIL,ZESTRIL) 2.5 MG tablet Take 2.5 mg by mouth daily. 03/12/15 03/11/16  Historical Provider, MD  meclizine (ANTIVERT) 12.5 MG tablet Take 12.5 mg by mouth daily as needed for dizziness.    Historical Provider, MD  montelukast (SINGULAIR) 10 MG tablet Take 1 tablet (10 mg total) by mouth every other day. Patient taking differently: Take 10 mg by mouth daily as needed (Alllergies).  01/05/15  Standley Brooking, MD    Family History Family History  Problem Relation Age of Onset  . Stroke Mother   . Cancer Father   . Stroke Brother     Social History Social History  Substance Use Topics  . Smoking status: Never Smoker  . Smokeless tobacco: Never Used  . Alcohol use No     Allergies   Fluconazole; Sulfa antibiotics; and Penicillins   Review of Systems Review of Systems  Gastrointestinal: Positive for nausea.  Neurological: Positive for dizziness.  All other systems reviewed and are negative.    Physical Exam Updated Vital Signs BP 171/80 (BP Location: Left  Arm)   Pulse 83   Temp 97.8 F (36.6 C) (Oral)   Resp 18   Ht 5\' 5"  (1.651 m)   Wt 204 lb (92.5 kg)   SpO2 100%   BMI 33.95 kg/m   Physical Exam  Constitutional: She is oriented to person, place, and time. She appears well-developed.  HENT:  Head: Normocephalic.  Eyes: Conjunctivae and EOM are normal. No scleral icterus.  Neck: Neck supple. No thyromegaly present.  Cardiovascular: Normal rate and regular rhythm.  Exam reveals no gallop and no friction rub.   No murmur heard. Pulmonary/Chest: Effort normal. No stridor. She has no wheezes. She has no rales. She exhibits no tenderness.  Abdominal: She exhibits no distension. There is no tenderness. There is no rebound.  Genitourinary:  Genitourinary Comments: Yellow discharge  Musculoskeletal: Normal range of motion. She exhibits no edema.  Lymphadenopathy:    She has no cervical adenopathy.  Neurological: She is oriented to person, place, and time. She exhibits normal muscle tone. Coordination normal.  Skin: No rash noted. No erythema.  Psychiatric: She has a normal mood and affect. Her behavior is normal.  Nursing note and vitals reviewed.    ED Treatments / Results  DIAGNOSTIC STUDIES: Oxygen Saturation is 100% on RA, normal by my interpretation.    COORDINATION OF CARE: 1:27 PM Discussed treatment plan with pt at bedside and pt agreed to plan.   Labs (all labs ordered are listed, but only abnormal results are displayed) Labs Reviewed  CBG MONITORING, ED - Abnormal; Notable for the following:       Result Value   Glucose-Capillary 175 (*)    All other components within normal limits    EKG  EKG Interpretation None       Radiology No results found.  Procedures Procedures (including critical care time)  Medications Ordered in ED Medications - No data to display   Initial Impression / Assessment and Plan / ED Course  I have reviewed the triage vital signs and the nursing notes.  Pertinent labs &  imaging results that were available during my care of the patient were reviewed by me and considered in my medical decision making (see chart for details).  Clinical Course     Patient with dizziness and vaginal discharge. Patient refused to have any tests done to evaluate her dizziness. She stated she would only have the discharge evaluated. Patient prep was positive for trichomoniasis and bacteria. She was treated with Flagyl. GC chlamydia is pending. Patient will follow-up with family doctor or GYN doctor  Final Clinical Impressions(s) / ED Diagnoses   Final diagnoses:  None    New Prescriptions New Prescriptions   No medications on file  The chart was scribed for me under my direct supervision.  I personally performed the history, physical, and medical decision making and all  procedures in the evaluation of this patient.Bethann Berkshire.    Tyanna Hach, MD 01/06/16 [phone]

## 2016-01-06 NOTE — ED Notes (Signed)
Pt reports hx of vertigo, was given Meclizine without improvement. Also states sinus infection and "yeast infection". Denies dysuria, hematuria, polyuria, fevers, N/V/D.

## 2016-01-06 NOTE — ED Triage Notes (Signed)
Pt reports dizziness starting last week with nausea.   Denies h/a, pt reports baseline weakness in right side due to stroke last year. Pt also has vaginal itching, thinks she has yeast infection.

## 2016-01-08 LAB — GC/CHLAMYDIA PROBE AMP (~~LOC~~) NOT AT ARMC
CHLAMYDIA, DNA PROBE: NEGATIVE
NEISSERIA GONORRHEA: NEGATIVE

## 2016-06-13 ENCOUNTER — Encounter (HOSPITAL_COMMUNITY): Payer: Self-pay | Admitting: Emergency Medicine

## 2016-06-13 ENCOUNTER — Emergency Department (HOSPITAL_COMMUNITY): Payer: Medicaid Other

## 2016-06-13 ENCOUNTER — Emergency Department (HOSPITAL_COMMUNITY)
Admission: EM | Admit: 2016-06-13 | Discharge: 2016-06-13 | Discharge status: Against Medical Advice | Payer: Medicaid Other | Attending: Emergency Medicine | Admitting: Emergency Medicine

## 2016-06-13 DIAGNOSIS — Z7982 Long term (current) use of aspirin: Secondary | ICD-10-CM | POA: Insufficient documentation

## 2016-06-13 DIAGNOSIS — Z7902 Long term (current) use of antithrombotics/antiplatelets: Secondary | ICD-10-CM | POA: Insufficient documentation

## 2016-06-13 DIAGNOSIS — R109 Unspecified abdominal pain: Secondary | ICD-10-CM

## 2016-06-13 DIAGNOSIS — Z79899 Other long term (current) drug therapy: Secondary | ICD-10-CM | POA: Diagnosis not present

## 2016-06-13 DIAGNOSIS — N898 Other specified noninflammatory disorders of vagina: Secondary | ICD-10-CM | POA: Insufficient documentation

## 2016-06-13 DIAGNOSIS — R1031 Right lower quadrant pain: Secondary | ICD-10-CM | POA: Diagnosis present

## 2016-06-13 DIAGNOSIS — R300 Dysuria: Secondary | ICD-10-CM | POA: Diagnosis not present

## 2016-06-13 HISTORY — DX: Urinary tract infection, site not specified: N39.0

## 2016-06-13 LAB — URINALYSIS, ROUTINE W REFLEX MICROSCOPIC
BILIRUBIN URINE: NEGATIVE
GLUCOSE, UA: NEGATIVE mg/dL
Hgb urine dipstick: NEGATIVE
KETONES UR: NEGATIVE mg/dL
Nitrite: NEGATIVE
PH: 6 (ref 5.0–8.0)
PROTEIN: NEGATIVE mg/dL
Specific Gravity, Urine: 1.02 (ref 1.005–1.030)

## 2016-06-13 NOTE — ED Provider Notes (Signed)
[facility] DEPT Provider Note   CSN: [id number] Arrival date & time: 06/13/16  1158     History   Chief Complaint Chief Complaint  Patient presents with  . Urinary Retention  . Flank Pain    HPI Megan Holloway is a 56 y.o. female.  HPI Pt was seen at 1215. Per pt, c/o gradual onset and persistence of constant dysuria, urinary frequency and urgency for the past 2 days. Pt also c/o "vaginal discharge."  Denies flank pain, no fevers, no abd pain, no N/V/D, no rash.    Past Medical History:  Diagnosis Date  . Dyslipidemia 01/05/2015  . Stroke (HCC)   . UTI (urinary tract infection)   . Vertigo     Patient Active Problem List   Diagnosis Date Noted  . Dyslipidemia 01/05/2015  . CVA (cerebral infarction) 01/03/2015    Past Surgical History:  Procedure Laterality Date  . CESAREAN SECTION    . UTERINE FIBROID SURGERY      OB History    No data available       Home Medications    Prior to Admission medications   Medication Sig Start Date End Date Taking? Authorizing Provider  acetaminophen (TYLENOL) 500 MG tablet Take 500 mg by mouth every 6 (six) hours as needed for mild pain.   Yes [provider]  aspirin 325 MG tablet Take 1 tablet (325 mg total) by mouth daily. 01/05/15  Yes Standley Brooking, MD  atorvastatin (LIPITOR) 80 MG tablet Take 1 tablet (80 mg total) by mouth daily at 6 PM. 01/05/15  Yes Standley Brooking, MD  azelastine (ASTELIN) 0.1 % nasal spray Place 1 spray into the nose daily as needed for rhinitis or allergies.  11/06/15 11/05/16 Yes [provider]  clopidogrel (PLAVIX) 75 MG tablet Take 1 tablet (75 mg total) by mouth daily. 01/05/15  Yes Standley Brooking, MD  clotrimazole (LOTRIMIN) 1 % cream Apply 1 application topically 2 (two) times daily as needed (Rash). Reported on 01/26/2015   Yes [provider]  cyclobenzaprine (FLEXERIL) 5 MG tablet Take 1 tablet (5 mg total) by mouth 3 (three) times daily as needed  for muscle spasms. 03/17/15  Yes Neese, Hope M, NP  fluticasone [facility]) 50 MCG/ACT nasal spray 2 sprays in each nostril daily 01/01/16  Yes [provider]  gabapentin (NEURONTIN) 300 MG capsule Take 1 capsule three times a day 12/18/15  Yes [provider]  lisinopril (PRINIVIL,ZESTRIL) 2.5 MG tablet Take 2.5 mg by mouth daily. 03/12/15 06/13/16 Yes [provider]  losartan (COZAAR) 50 MG tablet Take 50 mg by mouth daily.  12/26/15 12/25/16 Yes [provider]  meclizine (ANTIVERT) 12.5 MG tablet Take 12.5 mg by mouth daily as needed for dizziness.   Yes [provider]  metoprolol succinate (TOPROL-XL) 25 MG 24 hr tablet Take 1 tablet by mouth daily 12/17/15  Yes [provider]  montelukast (SINGULAIR) 10 MG tablet Take 1 tablet (10 mg total) by mouth every other day. Patient taking differently: Take 10 mg by mouth daily as needed (Alllergies).  01/05/15  Yes Standley Brooking, MD  rosuvastatin (CRESTOR) 10 MG tablet Take one tablet daily 12/17/15  Yes [provider]  tretinoin (RETIN-A) 0.1 % cream Apply 1 application topically at bedtime.  05/08/16 05/08/17 Yes [provider]    Family History Family History  Problem Relation Age of Onset  . Stroke Mother   . Cancer Father   . Stroke Brother  Social History Social History  Substance Use Topics  . Smoking status: Never Smoker  . Smokeless tobacco: Never Used  . Alcohol use No     Allergies   Fluconazole; Sulfa antibiotics; and Penicillins   Review of Systems Review of Systems ROS: Statement: All systems negative except as marked or noted in the HPI; Constitutional: Negative for fever and chills. ; ; Eyes: Negative for eye pain, redness and discharge. ; ; ENMT: Negative for ear pain, hoarseness, nasal congestion, sinus pressure and sore throat. ; ; Cardiovascular: Negative for chest pain, palpitations, diaphoresis, dyspnea and peripheral edema. ; ;  Respiratory: Negative for cough, wheezing and stridor. ; ; Gastrointestinal: Negative for nausea, vomiting, diarrhea, abdominal pain, blood in stool, hematemesis, jaundice and rectal bleeding. . ; ; Genitourinary: +dysuria. Negative for flank pain and hematuria. ; ; GYN:  No pelvic pain, no vaginal bleeding, +vaginal discharge, no vulvar pain. ;; Musculoskeletal: Negative for back pain and neck pain. Negative for swelling and trauma.; ; Skin: Negative for pruritus, rash, abrasions, blisters, bruising and skin lesion.; ; Neuro: Negative for headache, lightheadedness and neck stiffness. Negative for weakness, altered level of consciousness, altered mental status, extremity weakness, paresthesias, involuntary movement, seizure and syncope.       Physical Exam Updated Vital Signs BP (!) 164/84 (BP Location: Right Arm)   Pulse 84   Temp 98 F (36.7 C) (Oral)   Resp 17   Ht 5\' 5"  (1.651 m)   Wt 93 kg (205 lb)   SpO2 100%   BMI 34.11 kg/m   Physical Exam 1220: Physical examination:  Nursing notes reviewed; Vital signs and O2 SAT reviewed;  Constitutional: Well developed, Well nourished, Well hydrated, In no acute distress; Head:  Normocephalic, atraumatic; Eyes: EOMI, PERRL, No scleral icterus; ENMT: Mouth and pharynx normal, Mucous membranes moist; Neck: Supple, Full range of motion, No lymphadenopathy; Cardiovascular: Regular rate and rhythm, No gallop; Respiratory: Breath sounds clear & equal bilaterally, No wheezes.  Speaking full sentences with ease, Normal respiratory effort/excursion; Chest: Nontender, Movement normal; Abdomen: Soft, Nontender, Nondistended, Normal bowel sounds; Genitourinary: No CVA tenderness; Extremities: Pulses normal, No tenderness, No edema, No calf edema or asymmetry.; Neuro: AA&Ox3, Major CN grossly intact.  Speech clear. No gross focal motor or sensory deficits in extremities. Climbs on and off stretcher easily by herself. Gait steady.; Skin: Color normal, Warm,  Dry.   ED Treatments / Results  Labs (all labs ordered are listed, but only abnormal results are displayed)   EKG  EKG Interpretation None       Radiology   Procedures Procedures (including critical care time)  Medications Ordered in ED Medications - No data to display   Initial Impression / Assessment and Plan / ED Course  I have reviewed the triage vital signs and the nursing notes.  Pertinent labs & imaging results that were available during my care of the patient were reviewed by me and considered in my medical decision making (see chart for details).  MDM Reviewed: previous chart, vitals and nursing note Interpretation: labs   Results for orders placed or performed during the hospital encounter of 06/13/16  Urinalysis, Routine w reflex microscopic- may I&O cath if menses  Result Value Ref Range   Color, Urine YELLOW YELLOW   APPearance HAZY (A) CLEAR   Specific Gravity, Urine 1.020 1.005 - 1.030   pH 6.0 5.0 - 8.0   Glucose, UA NEGATIVE NEGATIVE mg/dL   Hgb urine dipstick NEGATIVE NEGATIVE   Bilirubin Urine NEGATIVE  NEGATIVE   Ketones, ur NEGATIVE NEGATIVE mg/dL   Protein, ur NEGATIVE NEGATIVE mg/dL   Nitrite NEGATIVE NEGATIVE   Leukocytes, UA LARGE (A) NEGATIVE   RBC / HPF 6-30 0 - 5 RBC/hpf   WBC, UA 6-30 0 - 5 WBC/hpf   Bacteria, UA RARE (A) NONE SEEN   Squamous Epithelial / LPF 6-30 (A) NONE SEEN   Mucous PRESENT      1220:  Due to her vaginal discharge complaints, I explained to pt that we would perform a pelvic exam, obtain a sample of the discharge, and send it to the lab for testing. Pt refuses pelvic exam, stating she will "wait for the urine results first."  1320:  No clear UTI on Udip, also is contaminated; UC pending. This explained to pt. Again explained reasoning for pelvic exam at length; pt refuses. Pt now reports right flank pain; no CVAT on exam. Offered CT renal stone study; pt refuses. Pt refuses further testing and wants to leave  now.  Pt informed re: dx testing results, and need for further pelvic exam and imaging study to r/o pelvic infection and renal calculus, and reasoning for this further testing.  Pt refuses.  I encouraged pt to stay, continues to refuse.  Pt makes her own medical decisions.  Risks of AMA explained to pt and family, including, but not limited to:  Pelvic exam, kidney stone, stroke, heart attack, cardiac arrythmia ("irregular heart rate/beat"), "passing out," temporary and/or permanent disability, death.  Pt verb understanding and continues to refuse further testing, understanding the consequences of her decision.  I encouraged pt to follow up with her PMD or OB/GYN tomorrow and return to the ED immediately if symptoms worsen, or for any other concerns.  Pt verb understanding, agreeable.      Final Clinical Impressions(s) / ED Diagnoses   Final diagnoses:  None    New Prescriptions New Prescriptions   No medications on file     Samuel Jester, DO 06/16/16 2130

## 2016-06-13 NOTE — ED Notes (Signed)
Went into patients to obtain vital signs. 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  Explained to patient MD can do pelvic exam if patient would agree, patient refused. Md made aware and spoke to patient.  Patient wanting to sign out AMA.

## 2016-06-15 LAB — URINE CULTURE: CULTURE: NO GROWTH

## 2016-06-26 ENCOUNTER — Encounter: Payer: Self-pay | Admitting: Obstetrics & Gynecology

## 2016-06-26 ENCOUNTER — Ambulatory Visit (INDEPENDENT_AMBULATORY_CARE_PROVIDER_SITE_OTHER): Payer: Medicaid Other | Admitting: Obstetrics & Gynecology

## 2016-06-26 ENCOUNTER — Encounter (INDEPENDENT_AMBULATORY_CARE_PROVIDER_SITE_OTHER): Payer: Self-pay

## 2016-06-26 VITALS — BP 140/90 | HR 80 | Wt 208.0 lb

## 2016-06-26 DIAGNOSIS — N950 Postmenopausal bleeding: Secondary | ICD-10-CM

## 2016-06-26 DIAGNOSIS — N760 Acute vaginitis: Secondary | ICD-10-CM

## 2016-06-26 MED ORDER — DOXYCYCLINE HYCLATE 100 MG PO TABS: 100.0000 mg | ORAL_TABLET | Freq: Two times a day (BID) | ORAL | 0 refills | Status: DC

## 2016-06-26 NOTE — Progress Notes (Signed)
Chief Complaint  Patient presents with  . Vaginal Discharge    Blood pressure 140/90, pulse 80, weight 208 lb (94.3 kg).  56 y.o. G3P0 No LMP recorded. Patient is postmenopausal. The current method of family planning is post menopausal status.  Outpatient Encounter Prescriptions as of 06/26/2016  Medication Sig Note  . acetaminophen (TYLENOL) 500 MG tablet Take 500 mg by mouth every 6 (six) hours as needed for mild pain.   Marland Kitchen. aspirin 325 MG tablet Take 1 tablet (325 mg total) by mouth daily.   Marland Kitchen. atorvastatin (LIPITOR) 80 MG tablet Take 1 tablet (80 mg total) by mouth daily at 6 PM.   . clopidogrel (PLAVIX) 75 MG tablet Take 1 tablet (75 mg total) by mouth daily.   . clotrimazole (LOTRIMIN) 1 % cream Apply 1 application topically 2 (two) times daily as needed (Rash). Reported on 01/26/2015   . cyclobenzaprine (FLEXERIL) 5 MG tablet Take 1 tablet (5 mg total) by mouth 3 (three) times daily as needed for muscle spasms.   Marland Kitchen. losartan (COZAAR) 50 MG tablet Take 50 mg by mouth daily.  [facility]06/2016: [facility] Sig: Take 1 tablet (50 mg total) by mouth once daily.  . meclizine (ANTIVERT) 12.5 MG tablet Take 12.5 mg by mouth daily as needed for dizziness.   . montelukast (SINGULAIR) 10 MG tablet Take 1 tablet (10 mg total) by mouth every other day. (Patient taking differently: Take 10 mg by mouth daily as needed (Alllergies). )   . rosuvastatin (CRESTOR) 10 MG tablet Take one tablet daily 01/06/2016: Received from: External Pharmacy  . tretinoin (RETIN-A) 0.1 % cream Apply 1 application topically at bedtime.    Marland Kitchen. azelastine (ASTELIN) 0.1 % nasal spray Place 1 spray into the nose daily as needed for rhinitis or allergies.    Marland Kitchen. doxycycline (VIBRA-TABS) 100 MG tablet Take 1 tablet (100 mg total) by mouth 2 (two) times daily.   . fluticasone (FLONASE) 50 MCG/ACT nasal spray 2 sprays in each nostril daily 01/06/2016: Received from: External Pharmacy  .  gabapentin (NEURONTIN) 300 MG capsule Take 1 capsule three times a day 01/06/2016: Received from: External Pharmacy  . lisinopril (PRINIVIL,ZESTRIL) 2.5 MG tablet Take 2.5 mg by mouth daily.   . metoprolol succinate (TOPROL-XL) 25 MG 24 hr tablet Take 1 tablet by mouth daily 01/06/2016: Received from: External Pharmacy   No facility-administered encounter medications on file as of 06/26/2016.     Subjective Megan Holloway's in a patient She presents complaining of a creamy irritating vaginal discharge for the past 3 weeks or so No severe itching no odor She denies being on antibiotics recently Of note she states this started about a month ago with 3 weeks of vaginal bleeding which she describes as mostly like spotting brownish pinkish never heavy never bright red She states she has a history of fibroids and underwent a myomectomy 3 years ago She states her Pap smear within the last year was normal She's had no pain associated with the discharge or the bleeding  Objective General WDWN female NAD Vulva:  normal appearing vulva with no masses, tenderness or lesions Vagina:  normal mucosa, thin grey discharge Cervix:  no cervical motion tenderness, no lesions and nulliparous appearance Uterus:  normal size, contour, position, consistency, mobility, non-tender Adnexa: ovaries:present,  normal adnexa in size, nontender and no masses    Pertinent ROS No burning with urination, frequency or urgency No nausea, vomiting or diarrhea Nor fever  chills or other constitutional symptoms   Labs or studies Wet Prep:   A sample of vaginal discharge was obtained from the posterior fornix using a cotton swab. 2 drops of saline were placed on a slide and the cotton swab was immersed in the saline. Microscopic evaluation was performed and results were as follows:  Negative  for yeast  Negative for clue cells , consistent with Bacterial vaginosis Negative for trichomonas  Abnormal- heavy WBC population     Whiff test: Negative     Impression Diagnoses this Encounter::   ICD-10-CM   1. Postmenopausal bleeding N95.0 US Pelvis Complete    US Transvaginal Non-OB  2. Acute vaginitis N76.0     Established relevant diagnosis(es):   Plan/Recommendations: Meds ordered this encounter  Medications  . doxycycline (VIBRA-TABS) 100 MG tablet    Sig: Take 1 tablet (100 mg total) by mouth 2 (two) times daily.    Dispense:  20 tablet    Refill:  0    Labs or Scans Ordered: Orders Placed This Encounter  Procedures  . US Pelvis Complete  . US Transvaginal Non-OB    Management:: The nonspecific vaginitis cervicitis can be simply because of postmenopausal state she does have atrophic vaginal changes I'll give her 10 days of doxycycline twice a day and she is cautioned to take it with food Additionally will see her back in July 2 for an ultrasound to evaluate the endometrium because of her postmenopausal bleeding At that time I'll also see how she is doing with her vaginitis cervicitis symptoms and repeat the wet prep at that time  Follow up Return in about 11 days (around 07/07/2016) for GYN sono, Follow up, with Dr Despina Hidden.      All questions were answered.  Past Medical History:  Diagnosis Date  . Dyslipidemia 01/05/2015  . Stroke (HCC)   . UTI (urinary tract infection)   . Vertigo     Past Surgical History:  Procedure Laterality Date  . CESAREAN SECTION    . UTERINE FIBROID SURGERY      OB History    Gravida Para Term Preterm AB Living   3             SAB TAB Ectopic Multiple Live Births                  Allergies  Allergen Reactions  . Fluconazole Swelling    Mouth burns and swells up  . Sulfa Antibiotics Hives  . Penicillins Rash    Has patient had a PCN reaction causing immediate rash, facial/tongue/throat swelling, SOB or lightheadedness with hypotension: No Has patient had a PCN reaction causing severe rash involving mucus membranes or skin necrosis: No Has  patient had a PCN reaction that required hospitalization No Has patient had a PCN reaction occurring within the last 10 years: No If all of the above answers are "NO", then may proceed with Cephalosporin use.     Social History   Social History  . Marital status: Single    Spouse name: N/A  . Number of children: N/A  . Years of education: N/A   Social History Main Topics  . Smoking status: Never Smoker  . Smokeless tobacco: Never Used  . Alcohol use No  . Drug use: No  . Sexual activity: Not Asked   Other Topics Concern  . None   Social History Narrative  . None    Family History  Problem Relation Age of Onset  . Stroke  Mother   . Cancer Father   . Stroke Brother

## 2016-06-28 ENCOUNTER — Other Ambulatory Visit: Payer: Self-pay | Admitting: Obstetrics & Gynecology

## 2016-06-28 MED ORDER — DOXYCYCLINE HYCLATE 100 MG PO TABS: 100.0000 mg | ORAL_TABLET | Freq: Two times a day (BID) | ORAL | 0 refills | Status: DC

## 2016-06-28 NOTE — Telephone Encounter (Signed)
Pt called the nurse line stating [facility] did not have her prescription for doxycycline I confirmed that it was sent electronically on the day of her visit but I resent the prescription to [facility] once again  Lazaro ArmsEURE,LUTHER H, MD 06/28/2016 10:10 AM

## 2016-06-30 ENCOUNTER — Telehealth: Payer: Self-pay | Admitting: Obstetrics & Gynecology

## 2016-06-30 NOTE — Telephone Encounter (Signed)
Pt called stating that she was supposed to have a prescription sent in this past Thursday. I advised pt that the Rx [facility]. [facility] Pt stated that she also needed a cream to be sent in because when she takes antibiotics she gets a yeast infection. Advised pt I would send this request to a provider. Pt verbalized understanding.

## 2016-06-30 NOTE — Telephone Encounter (Signed)
Pt called stating that she has not received  Her medication that Dr. Despina HiddenEure has prescribed her since Friday. Please contact pt

## 2016-07-01 ENCOUNTER — Other Ambulatory Visit: Payer: Self-pay | Admitting: Obstetrics & Gynecology

## 2016-07-01 MED ORDER — DOXYCYCLINE HYCLATE 100 MG PO TABS: 100.0000 mg | ORAL_TABLET | Freq: Two times a day (BID) | ORAL | 0 refills | Status: DC

## 2016-07-01 MED ORDER — TERCONAZOLE 0.4 % VA CREA: 1.0000 | TOPICAL_CREAM | Freq: Every day | VAGINAL | 0 refills | Status: AC

## 2016-07-01 NOTE — Telephone Encounter (Signed)
Pt informed that prescriptions for doxycycline & terazol cream had been sent to [facility] pharmacy.

## 2016-07-04 ENCOUNTER — Telehealth: Payer: Self-pay | Admitting: *Deleted

## 2016-07-04 NOTE — Telephone Encounter (Signed)
Patient called stating she has been taking the doxycycline and terconazole as prescribed but still has a discharge. I explained to patient that it could be the terconazole coming back out but patient states it is not. She took flagyl previously for vaginal discharge which she states it helped. Please advise.

## 2016-07-07 ENCOUNTER — Other Ambulatory Visit: Payer: Medicaid Other

## 2016-07-07 ENCOUNTER — Ambulatory Visit: Payer: Medicaid Other | Admitting: Obstetrics & Gynecology

## 2016-07-14 ENCOUNTER — Encounter: Payer: Self-pay | Admitting: Obstetrics & Gynecology

## 2016-07-14 ENCOUNTER — Ambulatory Visit (INDEPENDENT_AMBULATORY_CARE_PROVIDER_SITE_OTHER): Payer: Medicaid Other | Admitting: Obstetrics & Gynecology

## 2016-07-14 ENCOUNTER — Ambulatory Visit (INDEPENDENT_AMBULATORY_CARE_PROVIDER_SITE_OTHER): Payer: Medicaid Other

## 2016-07-14 VITALS — Wt 209.0 lb

## 2016-07-14 DIAGNOSIS — N950 Postmenopausal bleeding: Secondary | ICD-10-CM | POA: Diagnosis not present

## 2016-07-14 DIAGNOSIS — A5901 Trichomonal vulvovaginitis: Secondary | ICD-10-CM | POA: Diagnosis not present

## 2016-07-14 MED ORDER — METRONIDAZOLE 500 MG PO TABS: 500.0000 mg | ORAL_TABLET | Freq: Two times a day (BID) | ORAL | 0 refills | Status: DC

## 2016-07-14 NOTE — Progress Notes (Signed)
Chief Complaint  Patient presents with  . Follow-up    ultrasound/ Terconazole didn't work.    Weight 209 lb (94.8 kg).  56 y.o. G3P0 No LMP recorded. Patient is postmenopausal. The current method of family planning is post menopausal status.  Subjective Vaginal discharge for months Itching no Irritation yes Odor no Similar to previous kinda  Previous treatment flagyl, doxycycline  Objective Vulva:  normal appearing vulva with no masses, tenderness or lesions Vagina:  normal mucosa, thin grey discharge Cervix:  no lesions Uterus:   Adnexa: ovaries:,       Pertinent ROS No burning with urination, frequency or urgency No nausea, vomiting or diarrhea Nor fever chills or other constitutional symptoms   Labs or studies Wet Prep:   A sample of vaginal discharge was obtained from the posterior fornix using a cotton swab. 2 drops of saline were placed on a slide and the cotton swab was immersed in the saline. Microscopic evaluation was performed and results were as follows:  Negative  for yeast  Negative for clue cells , consistent with Bacterial vaginosis Positive for trichomonas  still heavy WBC population   Whiff test: Negative     Impression Diagnoses this Encounter::   ICD-10-CM   1. Trichomonas vaginitis A59.01   2. Postmenopausal bleeding N95.0     Established relevant diagnosis(es):   Plan/Recommendations: Meds ordered this encounter  Medications  . metroNIDAZOLE (FLAGYL) 500 MG tablet    Sig: Take 1 tablet (500 mg total) by mouth 2 (two) times daily.    Dispense:  14 tablet    Refill:  0    Labs or Scans Ordered: US Transvaginal Non-ob  Result Date: 07/14/2016 GYNECOLOGIC SONOGRAM Megan Holloway is a 56 y.o. G3P0 she is here for a pelvic sonogram for postmenopausal bleeding. Uterus                      9.7 x 5.1 x 5.8 cm, vol 151 ml, heterogeneous anteverted uterus w/ a posterior submucosal fibroid 3.5 x 4.2 x 3.3 cm Endometrium           6 mm, symmetrical,distorted thickened endometrium Right ovary             1.8 x 2.2 x 1.6 cm, wnl,limited view Left ovary                2.2 x 2.2 x 2.5 cm, wnl,limited view No free fluid Technician Comments: PELVIC US TA/TV: heterogeneous anteverted uterus w/ a posterior submucosal fibroid 3.5 x 4.2 x 3.3 cm,thicken endometrium 6 mm,endometrium is distorted by the posterior fibroid,normal ovaries bilat (limited view),pelvic pain during ultrasound [employer] 07/14/2016 3:06 PM Clinical Impression and recommendations: I have reviewed the sonogram results above, combined with the patient's current clinical course, below are my impressions and any appropriate recommendations for management based on the sonographic findings. Uterine size overall normal with fibroids, which have been known prior to this study Thickened endometrium, distorted by fibroid Both ovaries are normal, appear post menopausal Ortha Metts H 07/14/2016 3:41 PM   US Pelvis Complete  Result Date: 07/14/2016 GYNECOLOGIC SONOGRAM Megan Holloway is a 55 y.o. G3P0 she is here for a pelvic sonogram for postmenopausal bleeding. Uterus                      9.7 x 5.1 x 5.8 cm, vol 151 ml, heterogeneous anteverted uterus w/ a posterior submucosal fibroid 3.5 x 4.2 x  3.3 cm Endometrium          6 mm, symmetrical,distorted thickened endometrium Right ovary             1.8 x 2.2 x 1.6 cm, wnl,limited view Left ovary                2.2 x 2.2 x 2.5 cm, wnl,limited view No free fluid Technician Comments: PELVIC US TA/TV: heterogeneous anteverted uterus w/ a posterior submucosal fibroid 3.5 x 4.2 x 3.3 cm,thicken endometrium 6 mm,endometrium is distorted by the posterior fibroid,normal ovaries bilat (limited view),pelvic pain during ultrasound [employer] 07/14/2016 3:06 PM Clinical Impression and recommendations: I have reviewed the sonogram results above, combined with the patient's current clinical course, below are my impressions and any appropriate  recommendations for management based on the sonographic findings. Uterine size overall normal with fibroids, which have been known prior to this study Thickened endometrium, distorted by fibroid Both ovaries are normal, appear post menopausal Camree Wigington H 07/14/2016 3:41 PM    Management:: Flagyl 500 x 7 days Pt with relatively thin endometrium on sonogram with fibroid distorting endometrium, 6 mm, consider endometrial biopsy in future, pt does not want to do it today  Follow up Return in about 3 weeks (around 08/04/2016) for Follow up, wet prep with Dr Despina HiddenEure.       All questions were answered.   Past Medical History:  Diagnosis Date  . Dyslipidemia 01/05/2015  . Stroke (HCC)   . UTI (urinary tract infection)   . Vertigo     Past Surgical History:  Procedure Laterality Date  . CESAREAN SECTION    . UTERINE FIBROID SURGERY      OB History    Gravida Para Term Preterm AB Living   3             SAB TAB Ectopic Multiple Live Births                  Allergies  Allergen Reactions  . Fluconazole Swelling    Mouth burns and swells up  . Sulfa Antibiotics Hives  . Penicillins Rash    Has patient had a PCN reaction causing immediate rash, facial/tongue/throat swelling, SOB or lightheadedness with hypotension: No Has patient had a PCN reaction causing severe rash involving mucus membranes or skin necrosis: No Has patient had a PCN reaction that required hospitalization No Has patient had a PCN reaction occurring within the last 10 years: No If all of the above answers are "NO", then may proceed with Cephalosporin use.     Social History   Social History  . Marital status: Single    Spouse name: N/A  . Number of children: N/A  . Years of education: N/A   Social History Main Topics  . Smoking status: Never Smoker  . Smokeless tobacco: Never Used  . Alcohol use No  . Drug use: No  . Sexual activity: Not Asked   Other Topics Concern  . None   Social History  Narrative  . None    Family History  Problem Relation Age of Onset  . Stroke Mother   . Cancer Father   . Stroke Brother

## 2016-07-14 NOTE — Progress Notes (Signed)
PELVIC US TA/TV: heterogeneous anteverted uterus w/ a posterior submucosal fibroid 3.5 x 4.2 x 3.3 cm,thicken endometrium 6 mm,endometrium is distorted by the posterior fibroid,normal ovaries bilat (limited view),pelvic pain during ultrasound

## 2016-07-17 ENCOUNTER — Emergency Department (HOSPITAL_COMMUNITY): Payer: Medicaid Other

## 2016-07-17 ENCOUNTER — Other Ambulatory Visit: Payer: Self-pay

## 2016-07-17 ENCOUNTER — Telehealth: Payer: Self-pay | Admitting: *Deleted

## 2016-07-17 ENCOUNTER — Encounter (HOSPITAL_COMMUNITY): Payer: Self-pay | Admitting: *Deleted

## 2016-07-17 ENCOUNTER — Emergency Department (HOSPITAL_COMMUNITY)
Admission: EM | Admit: 2016-07-17 | Discharge: 2016-07-17 | Disposition: A | Discharge status: Home / Self Care | Payer: Medicaid Other | Attending: Emergency Medicine | Admitting: Emergency Medicine

## 2016-07-17 DIAGNOSIS — R1111 Vomiting without nausea: Secondary | ICD-10-CM | POA: Diagnosis not present

## 2016-07-17 DIAGNOSIS — Z7902 Long term (current) use of antithrombotics/antiplatelets: Secondary | ICD-10-CM | POA: Insufficient documentation

## 2016-07-17 DIAGNOSIS — K209 Esophagitis, unspecified without bleeding: Secondary | ICD-10-CM

## 2016-07-17 DIAGNOSIS — Z8673 Personal history of transient ischemic attack (TIA), and cerebral infarction without residual deficits: Secondary | ICD-10-CM | POA: Diagnosis not present

## 2016-07-17 DIAGNOSIS — Z7982 Long term (current) use of aspirin: Secondary | ICD-10-CM | POA: Diagnosis not present

## 2016-07-17 DIAGNOSIS — Z79899 Other long term (current) drug therapy: Secondary | ICD-10-CM | POA: Insufficient documentation

## 2016-07-17 DIAGNOSIS — R0602 Shortness of breath: Secondary | ICD-10-CM | POA: Diagnosis present

## 2016-07-17 LAB — URINALYSIS, ROUTINE W REFLEX MICROSCOPIC
Bilirubin Urine: NEGATIVE
Glucose, UA: NEGATIVE mg/dL
Hgb urine dipstick: NEGATIVE
Ketones, ur: NEGATIVE mg/dL
Nitrite: NEGATIVE
Protein, ur: NEGATIVE mg/dL
Specific Gravity, Urine: 1.016 (ref 1.005–1.030)
pH: 6 (ref 5.0–8.0)

## 2016-07-17 LAB — CBC
HCT: 41.1 % (ref 36.0–46.0)
Hemoglobin: 13.4 g/dL (ref 12.0–15.0)
MCH: 24.8 pg — ABNORMAL LOW (ref 26.0–34.0)
MCHC: 32.6 g/dL (ref 30.0–36.0)
MCV: 76 fL — ABNORMAL LOW (ref 78.0–100.0)
Platelets: 371 10*3/uL (ref 150–400)
RBC: 5.41 MIL/uL — ABNORMAL HIGH (ref 3.87–5.11)
RDW: 14.4 % (ref 11.5–15.5)
WBC: 7.5 10*3/uL (ref 4.0–10.5)

## 2016-07-17 LAB — COMPREHENSIVE METABOLIC PANEL
ALT: 17 U/L (ref 14–54)
AST: 19 U/L (ref 15–41)
Albumin: 4.2 g/dL (ref 3.5–5.0)
Alkaline Phosphatase: 82 U/L (ref 38–126)
Anion gap: 9 (ref 5–15)
BUN: 9 mg/dL (ref 6–20)
CO2: 28 mmol/L (ref 22–32)
Calcium: 9.6 mg/dL (ref 8.9–10.3)
Chloride: 102 mmol/L (ref 101–111)
Creatinine, Ser: 0.88 mg/dL (ref 0.44–1.00)
GFR calc Af Amer: >60 mL/min (ref >60)
GFR calc non Af Amer: >60 mL/min (ref >60)
Glucose, Bld: 120 mg/dL — ABNORMAL HIGH (ref 65–99)
Potassium: 4 mmol/L (ref 3.5–5.1)
Sodium: 139 mmol/L (ref 135–145)
Total Bilirubin: 0.6 mg/dL (ref 0.3–1.2)
Total Protein: 8.2 g/dL — ABNORMAL HIGH (ref 6.5–8.1)

## 2016-07-17 LAB — LIPASE, BLOOD: Lipase: 18 U/L (ref 11–51)

## 2016-07-17 MED ORDER — PANTOPRAZOLE SODIUM 20 MG PO TBEC: 20.0000 mg | DELAYED_RELEASE_TABLET | Freq: Every day | ORAL | 0 refills | Status: AC

## 2016-07-17 MED ORDER — METRONIDAZOLE 0.75 % VA GEL: 1.0000 | Freq: Two times a day (BID) | VAGINAL | 0 refills | Status: AC

## 2016-07-17 MED ORDER — GI COCKTAIL ~~LOC~~
30.0000 mL | Freq: Once | ORAL | Status: AC
  Administered 2016-07-17: 30 mL via ORAL
  Filled 2016-07-17: qty 30

## 2016-07-17 NOTE — ED Triage Notes (Addendum)
Pt c/o difficulty breathing and vomiting that started yesterday. Pt reports she started taking Flagyl, given to her by Dr. Despina HiddenEure, on Monday and she stopped taking it yesterday when she started feeling sick. O2 sats 100% on RA in triage. Pt also c/o productive yellow cough that started yesterday. Denies fever.

## 2016-07-17 NOTE — Telephone Encounter (Signed)
Patient called stating she has been throwing up since taking the Flagyl last night along with a sore throat. She is concerned she may be having an allergic reaction to the medication. Advised patient to not take the medication anymore. Can she be treated with a different antibiotic? Please advise.

## 2016-07-17 NOTE — ED Notes (Signed)
Pt ambulatory to waiting room. Pt verbalized understanding of discharge instructions.   

## 2016-07-17 NOTE — Telephone Encounter (Signed)
Didn't she take the flagyl before without problems?

## 2016-07-17 NOTE — ED Provider Notes (Signed)
[facility]Y DEPT Provider Note   CSN: [id number] Arrival date & time: 07/17/16  1515     History   Chief Complaint Chief Complaint  Patient presents with  . Shortness of Breath  . Emesis    HPI Megan Holloway is a 56 y.o. female.  HPI   56 year old female with dyspnea, chest tightness/chest burning. Onset yesterday. She began taking metronidazole for bacterial vaginosis early beat before symptoms started. She is concerned that this may be secondary to this. No fevers or chills. Denies any past history of reflux or peptic ulcer disease. She feels congested.   Past Medical History:  Diagnosis Date  . Dyslipidemia 01/05/2015  . Stroke (HCC)   . UTI (urinary tract infection)   . Vertigo     Patient Active Problem List   Diagnosis Date Noted  . Dyslipidemia 01/05/2015  . CVA (cerebral infarction) 01/03/2015    Past Surgical History:  Procedure Laterality Date  . CESAREAN SECTION    . UTERINE FIBROID SURGERY      OB History    Gravida Para Term Preterm AB Living   3             SAB TAB Ectopic Multiple Live Births                   Home Medications    Prior to Admission medications   Medication Sig Start Date End Date Taking? Authorizing Provider  aspirin 325 MG tablet Take 1 tablet (325 mg total) by mouth daily. 01/05/15  Yes Standley Brooking, MD  atorvastatin (LIPITOR) 80 MG tablet Take 1 tablet (80 mg total) by mouth daily at 6 PM. 01/05/15  Yes Standley Brooking, MD  azelastine (ASTELIN) 0.1 % nasal spray Place 1 spray into the nose daily as needed for rhinitis or allergies.  11/06/15 11/05/16 Yes [provider]  clopidogrel (PLAVIX) 75 MG tablet Take 1 tablet (75 mg total) by mouth daily. 01/05/15  Yes Standley Brooking, MD  clotrimazole (LOTRIMIN) 1 % cream Apply 1 application topically 2 (two) times daily as needed (Rash). Reported on 01/26/2015   Yes [provider]  cyclobenzaprine (FLEXERIL) 5 MG tablet Take 1 tablet (5 mg  total) by mouth 3 (three) times daily as needed for muscle spasms. 03/17/15  Yes Neese, Hope M, NP  fluticasone [facility]) 50 MCG/ACT nasal spray 2 sprays in each nostril weekly 01/01/16  Yes [provider]  acetaminophen (TYLENOL) 500 MG tablet Take 500 mg by mouth every 6 (six) hours as needed for mild pain.    [provider]  lisinopril (PRINIVIL,ZESTRIL) 2.5 MG tablet Take 2.5 mg by mouth daily. 03/12/15 06/13/16  [provider]  losartan (COZAAR) 50 MG tablet Take 50 mg by mouth daily.  12/26/15 12/25/16  [provider]  meclizine (ANTIVERT) 12.5 MG tablet Take 12.5 mg by mouth daily as needed for dizziness.    [provider]  metoprolol succinate (TOPROL-XL) 25 MG 24 hr tablet Take 1 tablet by mouth daily 12/17/15   [provider]  metroNIDAZOLE (FLAGYL) 500 MG tablet Take 1 tablet (500 mg total) by mouth 2 (two) times daily. 07/14/16   Lazaro Arms, MD  montelukast (SINGULAIR) 10 MG tablet Take 1 tablet (10 mg total) by mouth every other day. Patient taking differently: Take 10 mg by mouth daily as needed (Alllergies).  01/05/15   Standley Brooking, MD  rosuvastatin (CRESTOR) 10 MG tablet Take one tablet daily 12/17/15   [provider]  terconazole (TERAZOL 7) 0.4 % vaginal cream Place 1 applicator vaginally at bedtime. Patient not taking: Reported on 07/14/2016 07/01/16   Lazaro ArmsEure, Luther H, MD  tretinoin (RETIN-A) 0.1 % cream Apply 1 application topically at bedtime.  05/08/16 05/08/17  [provider]    Family History Family History  Problem Relation Age of Onset  . Stroke Mother   . Cancer Father   . Stroke Brother     Social History Social History  Substance Use Topics  . Smoking status: Never Smoker  . Smokeless tobacco: Never Used  . Alcohol use No     Allergies   Fluconazole; Sulfa antibiotics; and Penicillins   Review of Systems Review of Systems  All systems reviewed and negative, other than as  noted in HPI.   Physical Exam Updated Vital Signs BP (!) 185/73 (BP Location: Right Arm)   Pulse (!) 101   Temp 98.6 F (37 C) (Oral)   Resp 18   Ht 5\' 5"  (1.651 m)   Wt 93 kg (205 lb)   SpO2 99%   BMI 34.11 kg/m   Physical Exam  Constitutional: She appears well-developed and well-nourished. No distress.  HENT:  Head: Normocephalic and atraumatic.  Mouth/Throat: Oropharynx is clear and moist. No oropharyngeal exudate.  Eyes: Pupils are equal, round, and reactive to light. Conjunctivae are normal. Right eye exhibits no discharge. Left eye exhibits no discharge.  Neck: Neck supple.  Cardiovascular: Normal rate, regular rhythm and normal heart sounds.  Exam reveals no gallop and no friction rub.   No murmur heard. Pulmonary/Chest: Effort normal and breath sounds normal. No respiratory distress.  Abdominal: Soft. She exhibits no distension. There is no tenderness.  Musculoskeletal: She exhibits no edema or tenderness.  Lymphadenopathy:    She has no cervical adenopathy.  Neurological: She is alert.  Skin: Skin is warm and dry.  Psychiatric: She has a normal mood and affect. Her behavior is normal. Thought content normal.  Nursing note and vitals reviewed.    ED Treatments / Results  Labs (all labs ordered are listed, but only abnormal results are displayed) Labs Reviewed  COMPREHENSIVE METABOLIC PANEL - Abnormal; Notable for the following:       Result Value   Glucose, Bld 120 (*)    Total Protein 8.2 (*)    All other components within normal limits  CBC - Abnormal; Notable for the following:    RBC 5.41 (*)    MCV 76.0 (*)    MCH 24.8 (*)    All other components within normal limits  LIPASE, BLOOD  URINALYSIS, ROUTINE W REFLEX MICROSCOPIC    EKG  EKG Interpretation  Date/Time:  Thursday July 17 2016 15:33:55 EDT Ventricular Rate:  103 PR Interval:  132 QRS Duration: 84 QT Interval:  324 QTC Calculation: 424 R Axis:   54 Text Interpretation:  Sinus  tachycardia Otherwise normal ECG Confirmed by Jacalyn LefevreHaviland, Julie [phone]([id number]) on 07/17/2016 3:48:58 PM       Radiology Dg Chest 2 View  Result Date: 07/17/2016 CLINICAL DATA:  Chest pain, shortness of breath and cough for 1 week. EXAM: CHEST  2 VIEW COMPARISON:  None. FINDINGS: The lungs are clear. Heart size is normal. No pneumothorax or pleural effusion. No acute bony abnormality. IMPRESSION: No acute disease. Electronically Signed   By: Drusilla Kannerhomas  Dalessio M.D.   On: 07/17/2016 16:00    Procedures Procedures (including critical care time)  Medications Ordered in ED Medications  gi cocktail (Maalox,Lidocaine,Donnatal) (not  administered)     Initial Impression / Assessment and Plan / ED Course  I have reviewed the triage vital signs and the nursing notes.  Pertinent labs & imaging results that were available during my care of the patient were reviewed by me and considered in my medical decision making (see chart for details).     56yF Symptoms consistent with esophagitis. Patient is concerned that maybe related to metronidazole. I doubt it. Regardless, she was changed to metronidazole gel. Advised to stop taking oral metronidazole. We'll also place her on a PPI.  Final Clinical Impressions(s) / ED Diagnoses   Final diagnoses:  None    New Prescriptions Discharge Medication List as of 07/17/2016  7:17 PM    START taking these medications   Details  metroNIDAZOLE (METROGEL) 0.75 % vaginal gel Place 1 Applicatorful vaginally 2 (two) times daily., Starting Thu 07/17/2016, Print    pantoprazole (PROTONIX) 20 MG tablet Take 1 tablet (20 mg total) by mouth daily., Starting Thu 07/17/2016, Print         Raeford Razor, MD 07/23/16 1742

## 2016-07-17 NOTE — Telephone Encounter (Signed)
Called patient but no answer.

## 2016-07-22 NOTE — Telephone Encounter (Signed)
Patient states she did not finish taking the Flagyl because she developed chest congestion, runny nose and a cough. States she read that it could cause an upper respiratory infection so she didn't want to take anymore. Please advise.

## 2016-08-08 ENCOUNTER — Ambulatory Visit: Payer: Medicaid Other | Admitting: Obstetrics & Gynecology

## 2016-12-17 IMAGING — DX DG SHOULDER 2+V*R*
2 series · 2 of 2 positions shown · non-contrast
Comparison: None.

CLINICAL DATA: Right shoulder pain, no known injury

EXAM:
RIGHT SHOULDER - 2+ VIEW

[shoulder ap]
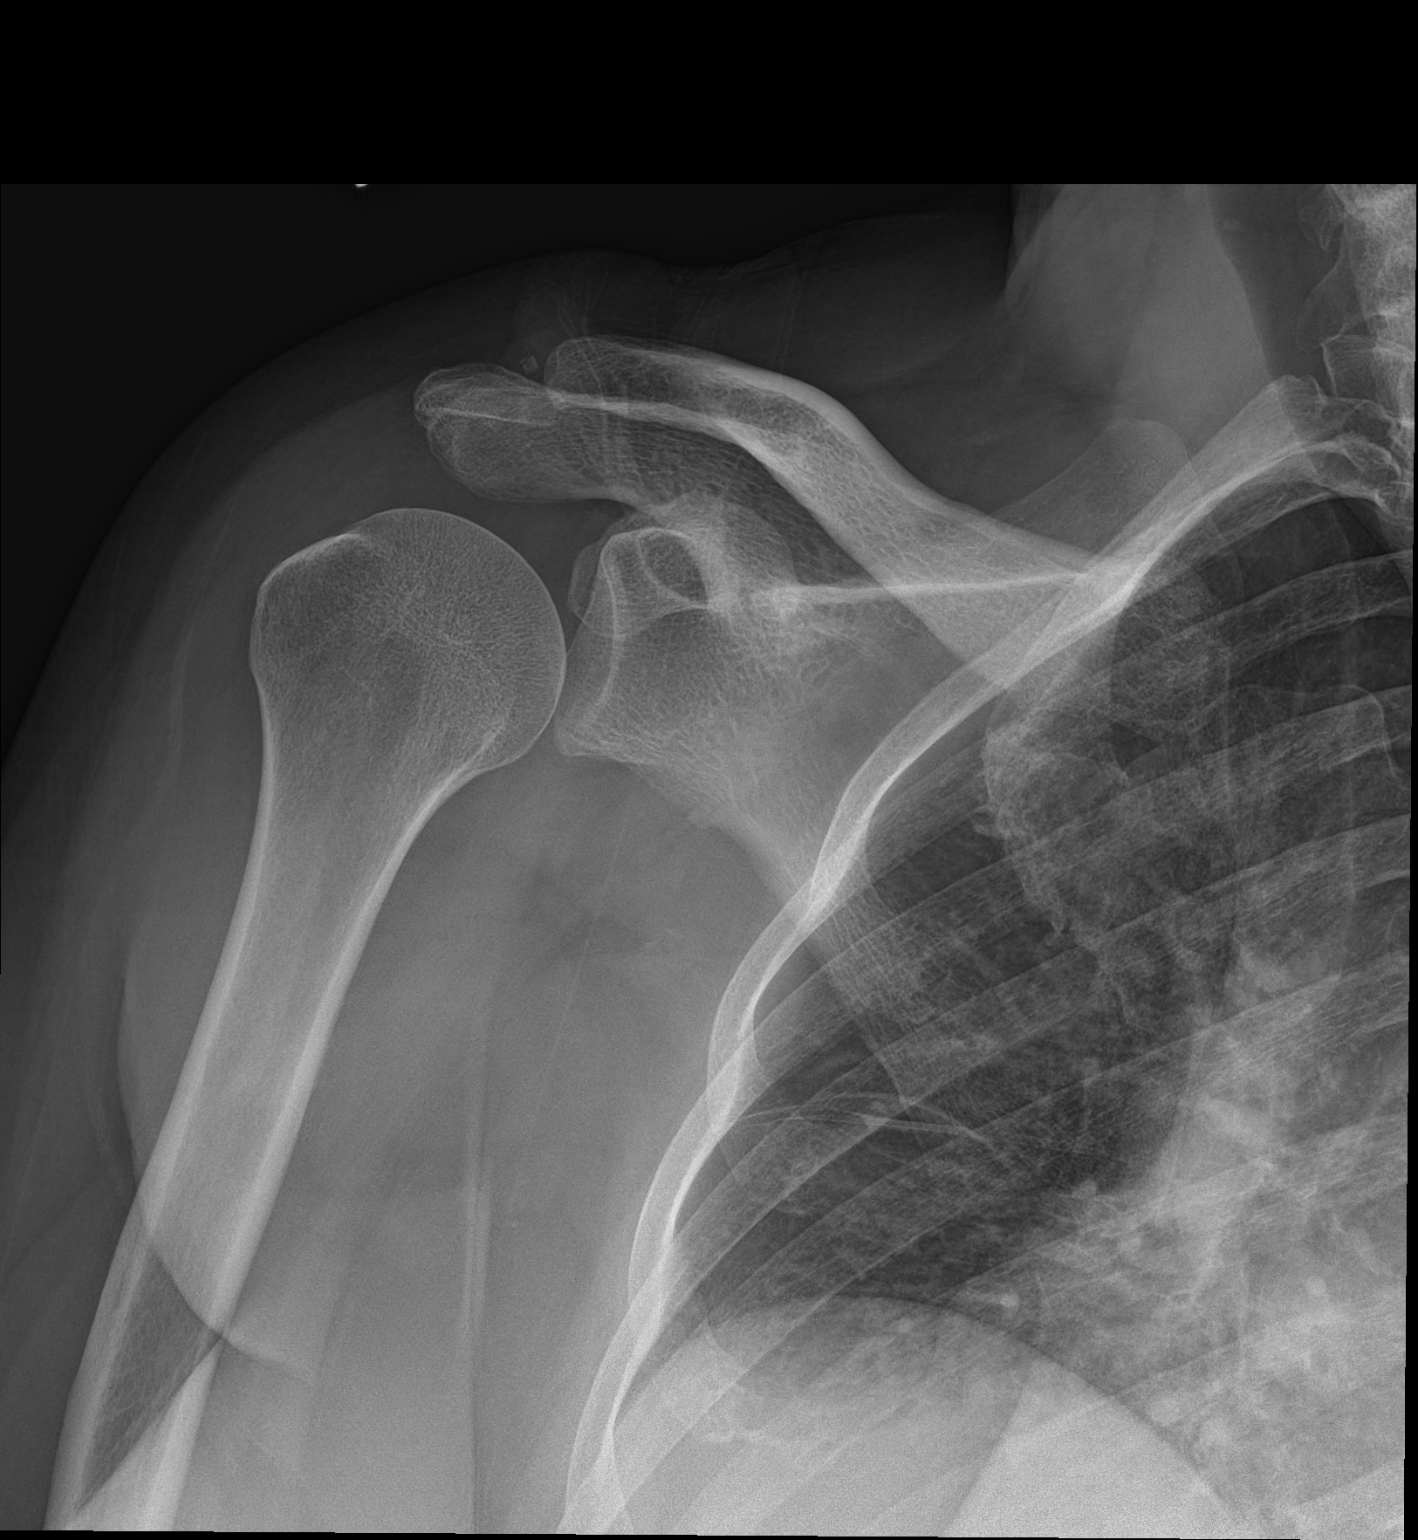

[shoulder y view]
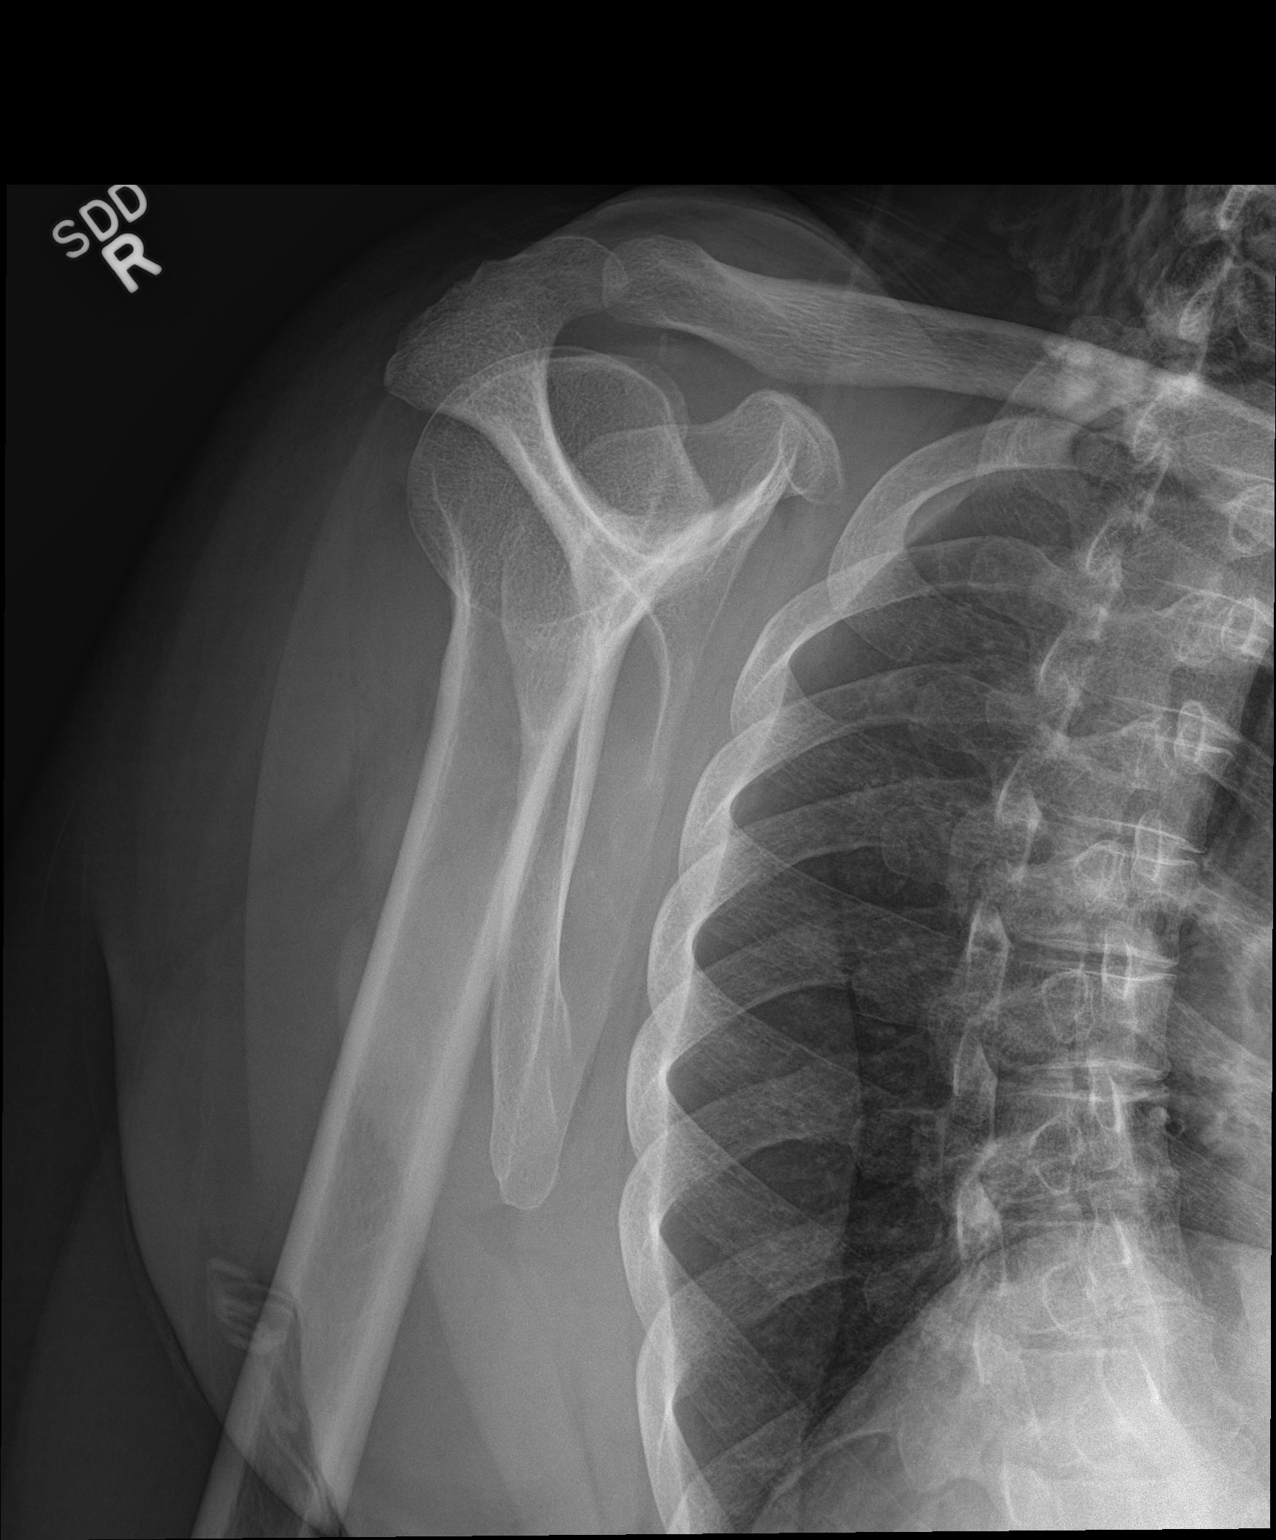

[2 of 2 positions shown; findings below may reference images not displayed]

FINDINGS: No fracture or dislocation is seen.

The joint spaces are preserved.

Visualized soft tissues are within normal limits.

Visualized right lung is clear.
IMPRESSION: No fracture or dislocation is seen.

## 2017-08-28 ENCOUNTER — Emergency Department (HOSPITAL_COMMUNITY): Payer: Medicaid Other

## 2017-08-28 ENCOUNTER — Emergency Department (HOSPITAL_COMMUNITY)
Admission: EM | Admit: 2017-08-28 | Discharge: 2017-08-28 | Disposition: A | Discharge status: Home / Self Care | Payer: Medicaid Other | Attending: Emergency Medicine | Admitting: Emergency Medicine

## 2017-08-28 ENCOUNTER — Encounter (HOSPITAL_COMMUNITY): Payer: Self-pay | Admitting: Emergency Medicine

## 2017-08-28 ENCOUNTER — Other Ambulatory Visit: Payer: Self-pay

## 2017-08-28 DIAGNOSIS — Y33XXXA Other specified events, undetermined intent, initial encounter: Secondary | ICD-10-CM | POA: Diagnosis not present

## 2017-08-28 DIAGNOSIS — Y998 Other external cause status: Secondary | ICD-10-CM | POA: Diagnosis not present

## 2017-08-28 DIAGNOSIS — S39012A Strain of muscle, fascia and tendon of lower back, initial encounter: Secondary | ICD-10-CM | POA: Diagnosis not present

## 2017-08-28 DIAGNOSIS — Y929 Unspecified place or not applicable: Secondary | ICD-10-CM | POA: Insufficient documentation

## 2017-08-28 DIAGNOSIS — E785 Hyperlipidemia, unspecified: Secondary | ICD-10-CM | POA: Diagnosis not present

## 2017-08-28 DIAGNOSIS — Z7982 Long term (current) use of aspirin: Secondary | ICD-10-CM | POA: Diagnosis not present

## 2017-08-28 DIAGNOSIS — Z7902 Long term (current) use of antithrombotics/antiplatelets: Secondary | ICD-10-CM | POA: Diagnosis not present

## 2017-08-28 DIAGNOSIS — Z79899 Other long term (current) drug therapy: Secondary | ICD-10-CM | POA: Diagnosis not present

## 2017-08-28 DIAGNOSIS — Z8673 Personal history of transient ischemic attack (TIA), and cerebral infarction without residual deficits: Secondary | ICD-10-CM | POA: Diagnosis not present

## 2017-08-28 DIAGNOSIS — Y93E5 Activity, floor mopping and cleaning: Secondary | ICD-10-CM | POA: Insufficient documentation

## 2017-08-28 DIAGNOSIS — S3992XA Unspecified injury of lower back, initial encounter: Secondary | ICD-10-CM | POA: Diagnosis present

## 2017-08-28 MED ORDER — METHOCARBAMOL 500 MG PO TABS
500.0000 mg | ORAL_TABLET | Freq: Two times a day (BID) | ORAL | 0 refills | Status: AC | PRN
Start: 2017-08-28 — End: ?

## 2017-08-28 MED ORDER — IBUPROFEN 800 MG PO TABS
800.0000 mg | ORAL_TABLET | Freq: Once | ORAL | Status: AC
  Administered 2017-08-28: 800 mg via ORAL
  Filled 2017-08-28: qty 1

## 2017-08-28 MED ORDER — IBUPROFEN 600 MG PO TABS: 600.0000 mg | ORAL_TABLET | Freq: Four times a day (QID) | ORAL | 0 refills | Status: AC | PRN

## 2017-08-28 NOTE — Discharge Instructions (Signed)
Your xrays look Randie HeinzGreat!  No signs of fractures - bones look good. Please continue the follow medications and start Ibuprofen high dose.  Robaxin 500mg  up to every 8 hours for muscle relaxation - do not use this with other muscle relaxants. Ibuprofen 600mg  every 8 hours for pain  Rest your back for the next 2 days - walking - no heavy lifting - then gradually increase your activity -   See your doctor for pain lasting more than 1 week  ER for increased pain / weakness / numbness / difficulty urinating or fevers with the back pain.

## 2017-08-28 NOTE — ED Notes (Signed)
Patient returned from X-ray 

## 2017-08-28 NOTE — ED Provider Notes (Signed)
[facility] Provider Note   CSN: [id number] Arrival date & time: 08/28/17  0732     History   Chief Complaint Chief Complaint  Patient presents with  . Back Pain    HPI Megan Holloway is a 57 y.o. female.  HPI  The patient is a 57 year old female, she has a known history of stroke and hyperlipidemia, she has no prior history of back problems including fractures and has no history of cancer, IV drug use, fevers.  She reports that last week while she was vacuuming the vacuum slipped and she made a quick movement to try to catch it as it was falling to the ground, this caused acute onset of pain in her right lower back which has been more or less constant since that time.  While the pain does seem to come and go she has a certain level of underlying constant pain which does get worse with movement and position.  She has not been seen by another provider for this up to this point.  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  She has not been using anti-inflammatories, she denies numbness or weakness of the legs, she denies urinary retention or incontinence.  This pain does extend into the nighttime and causes her to have some difficulty sleeping.  It does not radiate to the abdomen or the left side and does not cause any nausea vomiting hematuria dysuria or changes in her bowel habits.  Past Medical History:  Diagnosis Date  . Dyslipidemia 01/05/2015  . Stroke (HCC)   . UTI (urinary tract infection)   . Vertigo     Patient Active Problem List   Diagnosis Date Noted  . Dyslipidemia 01/05/2015  . CVA (cerebral infarction) 01/03/2015    Past Surgical History:  Procedure Laterality Date  . CESAREAN SECTION    . UTERINE FIBROID SURGERY       OB History    Gravida  3   Para      Term      Preterm      AB      Living        SAB        TAB      Ectopic      Multiple      Live Births               Home Medications    Prior to Admission medications   Medication Sig Start Date End Date Taking? Authorizing Provider  acetaminophen (TYLENOL) 500 MG tablet Take 500 mg by mouth every 6 (six) hours as needed for mild pain.    [provider]  aspirin 325 MG tablet Take 1 tablet (325 mg total) by mouth daily. 01/05/15   Standley BrookingGoodrich, Daniel P, MD  atorvastatin (LIPITOR) 80 MG tablet Take 1 tablet (80 mg total) by mouth daily at 6 PM. 01/05/15   Standley BrookingGoodrich, Daniel P, MD  azelastine (ASTELIN) 0.1 % nasal spray Place 1 spray into the nose daily as needed for rhinitis or allergies.  11/06/15 11/05/16  [provider]  clopidogrel (PLAVIX) 75 MG tablet Take 1 tablet (75 mg total) by mouth daily. 01/05/15   Standley BrookingGoodrich, Daniel P, MD  clotrimazole (LOTRIMIN) 1 % cream Apply 1 application topically 2 (two) times daily as needed (Rash). Reported on 01/26/2015    [provider]  cyclobenzaprine (FLEXERIL) 5 MG tablet Take 1 tablet (5 mg total) by mouth 3 (three) times daily as needed for muscle spasms. 03/17/15   Janne Napoleon, NP  fluticasone [facility]) 50 MCG/ACT nasal spray 2 sprays in each nostril weekly 01/01/16   [provider]  ibuprofen (ADVIL,MOTRIN) 600 MG tablet Take 1 tablet (600 mg total) by mouth every 6 (six) hours as needed. 08/28/17   Eber Hong, MD  losartan (COZAAR) 100 MG tablet Take 100 mg by mouth daily.  12/26/15 12/25/16  [provider]  meclizine (ANTIVERT) 12.5 MG tablet Take 12.5 mg by mouth 3 (three) times daily as needed for dizziness or nausea.     [provider]  methocarbamol (ROBAXIN) 500 MG tablet Take 1 tablet (500 mg total) by mouth 2 (two) times daily as needed for muscle spasms. 08/28/17   Eber Hong, MD  metoprolol succinate (TOPROL-XL) 25 MG 24 hr tablet Take 1 tablet by mouth daily 12/17/15   [provider]  metroNIDAZOLE  (METROGEL) 0.75 % vaginal gel Place 1 Applicatorful vaginally 2 (two) times daily. 07/17/16   Raeford Razor, MD  montelukast (SINGULAIR) 10 MG tablet Take 1 tablet (10 mg total) by mouth every other day. Patient taking differently: Take 10 mg by mouth daily as needed (Alllergies).  01/05/15   Standley Brooking, MD  mupirocin cream (BACTROBAN) 2 % Apply 1 application topically 2 (two) times daily. Until healed (prescribed 06/27/2016)    [provider]  nystatin cream (MYCOSTATIN) Apply 1 application topically 2 (two) times daily. Prescribed on 06/27/2016    [provider]  pantoprazole (PROTONIX) 20 MG tablet Take 1 tablet (20 mg total) by mouth daily. 07/17/16   Raeford Razor, MD  rosuvastatin (CRESTOR) 10 MG tablet Take one tablet daily 12/17/15   [provider]  terconazole (TERAZOL 7) 0.4 % vaginal cream Place 1 applicator vaginally at bedtime. 07/01/16   Lazaro Arms, MD    Family History Family History  Problem Relation Age of Onset  . Stroke Mother   . Cancer Father   . Stroke Brother     Social History Social History   Tobacco Use  . Smoking status: Never Smoker  . Smokeless tobacco: Never Used  Substance Use Topics  . Alcohol use: No  . Drug use: No     Allergies   Fluconazole; Sulfa antibiotics; and Penicillins   Review of Systems Review of Systems  All other systems reviewed and are negative.    Physical Exam Updated Vital Signs BP (!) 178/72 (BP Location: Right Arm)   Pulse 89   Temp 98.3 F (36.8 C) (Oral)   Resp 19   Ht 5\' 5"  (1.651 m)   Wt 93 kg   SpO2 95%   BMI 34.11 kg/m   Physical Exam  Constitutional: She appears well-developed and well-nourished. No distress.  HENT:  Head: Normocephalic and atraumatic.  Mouth/Throat: Oropharynx is clear and moist. No oropharyngeal exudate.  Eyes: Pupils are equal, round, and reactive to light. Conjunctivae and EOM are normal. Right eye exhibits no discharge. Left eye exhibits  no discharge. No scleral icterus.  Neck: Normal range of motion. Neck supple. No JVD present. No thyromegaly present.  Cardiovascular: Normal rate, regular rhythm, normal heart sounds and intact distal pulses. Exam reveals no gallop and no friction rub.  No murmur heard. Pulmonary/Chest: Effort normal and breath sounds normal. No respiratory distress. She has no wheezes. She has no rales.  Abdominal: Soft. Bowel  sounds are normal. She exhibits no distension and no mass. There is no tenderness.  Abdominal exam is benign without any tenderness in the right upper quadrant or right lower quadrant.  There is mild right CVA tenderness  Musculoskeletal: Normal range of motion. She exhibits no edema or tenderness.  The patient has normal compartments in her legs, she has normal strength in all 4 extremities, she has normal range of motion of all of her joints in the lower extremities.  She does have no tenderness to palpation over the left side of the back or the spinal area, there is tenderness in the right paraspinal and flank on the right side.  No rashes seen  Lymphadenopathy:    She has no cervical adenopathy.  Neurological: She is alert. Coordination normal.  Normal gait, normal strength and sensation in all 4 extremities  Skin: Skin is warm and dry. No rash noted. No erythema.  Psychiatric: She has a normal mood and affect. Her behavior is normal.  Nursing note and vitals reviewed.    ED Treatments / Results  Labs (all labs ordered are listed, but only abnormal results are displayed) Labs Reviewed - No data to display  EKG None  Radiology Dg Lumbar Spine Complete  Result Date: 08/28/2017 CLINICAL DATA:  Back pain, right-sided pain after a fall 1 week ago EXAM: LUMBAR SPINE - COMPLETE 4+ VIEW COMPARISON:  None. FINDINGS: There is no evidence of lumbar spine fracture. Alignment is normal. Intervertebral disc spaces are maintained. IMPRESSION: No acute osseous injury of the lumbar spine.  Electronically Signed   By: Elige Ko   On: 08/28/2017 08:34    Procedures Procedures (including critical care time)  Medications Ordered in ED Medications  ibuprofen (ADVIL,MOTRIN) tablet 800 mg (800 mg Oral Given 08/28/17 4098)     Initial Impression / Assessment and Plan / ED Course  I have reviewed the triage vital signs and the nursing notes.  Pertinent labs & imaging results that were available during my care of the patient were reviewed by me and considered in my medical decision making (see chart for details).  Clinical Course as of Aug 28 840  Fri Aug 28, 2017  1191 I have personally viewed the xray and I see no signs of fracture or dislocation - there is no acute findings based on radiology read either - will plan on treating with NSAIDs - muscle relaxants.  Pt informed of the findings and is in agreement with the plan.   [BM]    Clinical Course User Index [BM] Eber Hong, MD    Back pain, likely musculoskeletal, given the patient's age and her lack of relief of pain at night (she does get some relief but not complete relief) we will perform a lumbar spine x-ray.  She will also be given an anti-inflammatory to augment her muscle relaxer.  Final Clinical Impressions(s) / ED Diagnoses   Final diagnoses:  Strain of lumbar region, initial encounter    ED Discharge Orders         Ordered    ibuprofen (ADVIL,MOTRIN) 600 MG tablet  Every 6 hours PRN     08/28/17 0839    methocarbamol (ROBAXIN) 500 MG tablet  2 times daily PRN     08/28/17 0839           Eber Hong, MD 08/29/17 [phone]

## 2017-08-28 NOTE — ED Triage Notes (Signed)
Pt reports right sided back pain for last several days. Pt reports pain became intense "when made a quick movement trying to catch the vacuum cleaner when it turned over." pt also reports was treated for UTI x3 weeks ago. Pt reports "my doctor said I could have a kidney stone if my pain didn't get any better." pt denies any dysuria, fever, nausea, emesis.

## 2017-12-24 ENCOUNTER — Other Ambulatory Visit: Payer: Self-pay

## 2017-12-24 ENCOUNTER — Emergency Department (HOSPITAL_COMMUNITY): Payer: Medicaid Other

## 2017-12-24 ENCOUNTER — Emergency Department (HOSPITAL_COMMUNITY)
Admission: EM | Admit: 2017-12-24 | Discharge: 2017-12-24 | Disposition: A | Discharge status: Home / Self Care | Payer: Medicaid Other | Attending: Emergency Medicine | Admitting: Emergency Medicine

## 2017-12-24 ENCOUNTER — Encounter (HOSPITAL_COMMUNITY): Payer: Self-pay

## 2017-12-24 DIAGNOSIS — J069 Acute upper respiratory infection, unspecified: Secondary | ICD-10-CM | POA: Diagnosis not present

## 2017-12-24 DIAGNOSIS — J209 Acute bronchitis, unspecified: Secondary | ICD-10-CM | POA: Insufficient documentation

## 2017-12-24 DIAGNOSIS — Z7902 Long term (current) use of antithrombotics/antiplatelets: Secondary | ICD-10-CM | POA: Diagnosis not present

## 2017-12-24 DIAGNOSIS — Z79899 Other long term (current) drug therapy: Secondary | ICD-10-CM | POA: Insufficient documentation

## 2017-12-24 DIAGNOSIS — R05 Cough: Secondary | ICD-10-CM | POA: Diagnosis present

## 2017-12-24 DIAGNOSIS — R030 Elevated blood-pressure reading, without diagnosis of hypertension: Secondary | ICD-10-CM | POA: Insufficient documentation

## 2017-12-24 DIAGNOSIS — Z7982 Long term (current) use of aspirin: Secondary | ICD-10-CM | POA: Diagnosis not present

## 2017-12-24 HISTORY — DX: Chronic sinusitis, unspecified: J32.9

## 2017-12-24 MED ORDER — AZITHROMYCIN 250 MG PO TABS: 250.0000 mg | ORAL_TABLET | Freq: Every day | ORAL | 0 refills | Status: AC

## 2017-12-24 MED ORDER — BENZONATATE 100 MG PO CAPS: 200.0000 mg | ORAL_CAPSULE | Freq: Three times a day (TID) | ORAL | 0 refills | Status: AC | PRN

## 2017-12-24 MED ORDER — BENZONATATE 100 MG PO CAPS
200.0000 mg | ORAL_CAPSULE | Freq: Once | ORAL | Status: AC
  Administered 2017-12-24: 200 mg via ORAL
  Filled 2017-12-24: qty 2

## 2017-12-24 MED ORDER — FAMOTIDINE 20 MG PO TABS
20.0000 mg | ORAL_TABLET | Freq: Once | ORAL | Status: AC
  Administered 2017-12-24: 20 mg via ORAL
  Filled 2017-12-24: qty 1

## 2017-12-24 MED ORDER — FAMOTIDINE 20 MG PO TABS: 20.0000 mg | ORAL_TABLET | Freq: Two times a day (BID) | ORAL | 0 refills | Status: AC

## 2017-12-24 MED ORDER — AZITHROMYCIN 250 MG PO TABS
500.0000 mg | ORAL_TABLET | Freq: Once | ORAL | Status: AC
  Administered 2017-12-24: 500 mg via ORAL
  Filled 2017-12-24: qty 2

## 2017-12-24 MED ORDER — AZELASTINE HCL 0.1 % NA SOLN: 1.0000 | Freq: Two times a day (BID) | NASAL | 12 refills | Status: AC | PRN

## 2017-12-24 NOTE — ED Provider Notes (Signed)
[facility] EMERGENCY DEPARTMENT Provider Note   CSN: [id number] Arrival date & time: 12/24/17  4098     History   Chief Complaint Chief Complaint  Patient presents with  . Nasal Congestion    HPI Megan Holloway is a 57 y.o. female with a history of hypertension, dyslipidemia and CVA presenting with a 2-day history of URI type symptoms which include nasal congestion with white but thick nasal discharge in association with cough which is also productive of thick white sputum production.  She denies shortness of breath but does endorse midsternal burning chest pain which is triggered by coughing and is gone at rest.  She has had no nausea or vomiting, no recognized fevers at home.  She has had no medications for her symptoms prior to arrival.  She has concerns about an upcoming fibroid surgery which is scheduled for tomorrow with a surgeon in [place].  She was advised to present here for further evaluation of her symptoms to determine whether she is safe for surgery tomorrow.  Of note, patient's blood pressure significantly elevated at first presentation.  She has not taken her morning medicines this morning.  She was offered her a.m. dose of lisinopril which she declined, stating she needs to eat a full meal when she takes his medication or it makes her nauseated.  The history is provided by the patient.    Past Medical History:  Diagnosis Date  . Dyslipidemia 01/05/2015  . Sinusitis   . Stroke (HCC)   . UTI (urinary tract infection)   . Vertigo     Patient Active Problem List   Diagnosis Date Noted  . Dyslipidemia 01/05/2015  . CVA (cerebral infarction) 01/03/2015    Past Surgical History:  Procedure Laterality Date  . CESAREAN SECTION    . UTERINE FIBROID SURGERY       OB History    Gravida  3   Para      Term      Preterm      AB      Living        SAB      TAB      Ectopic      Multiple      Live Births               Home Medications     Prior to Admission medications   Medication Sig Start Date End Date Taking? Authorizing Provider  acetaminophen (TYLENOL) 500 MG tablet Take 500 mg by mouth every 6 (six) hours as needed for mild pain.    [provider]  aspirin 325 MG tablet Take 1 tablet (325 mg total) by mouth daily. 01/05/15   Standley Brooking, MD  atorvastatin (LIPITOR) 80 MG tablet Take 1 tablet (80 mg total) by mouth daily at 6 PM. 01/05/15   Standley Brooking, MD  azelastine (ASTELIN) 0.1 % nasal spray Place 1 spray into both nostrils 2 (two) times daily as needed for rhinitis. Use in each nostril as directed 12/24/17   Chayah Mckee, Raynelle Fanning, PA-C  azithromycin (ZITHROMAX) 250 MG tablet Take 1 tablet (250 mg total) by mouth daily for 4 days. Take one tablet daily starting 12/25/17 12/24/17 12/28/17  Burgess Amor, PA-C  benzonatate (TESSALON) 100 MG capsule Take 2 capsules (200 mg total) by mouth 3 (three) times daily as needed. 12/24/17   Burgess Amor, PA-C  clopidogrel (PLAVIX) 75 MG tablet Take 1 tablet (75 mg total) by mouth daily. 01/05/15   Irene Limbo,  Melton Alaraniel P, MD  clotrimazole (LOTRIMIN) 1 % cream Apply 1 application topically 2 (two) times daily as needed (Rash). Reported on 01/26/2015    [provider]  cyclobenzaprine (FLEXERIL) 5 MG tablet Take 1 tablet (5 mg total) by mouth 3 (three) times daily as needed for muscle spasms. 03/17/15   Janne NapoleonNeese, Hope M, NP  famotidine (PEPCID) 20 MG tablet Take 1 tablet (20 mg total) by mouth 2 (two) times daily. 12/24/17   Burgess AmorIdol, Aras Albarran, PA-C  ibuprofen (ADVIL,MOTRIN) 600 MG tablet Take 1 tablet (600 mg total) by mouth every 6 (six) hours as needed. 08/28/17   Eber HongMiller, Brian, MD  losartan (COZAAR) 100 MG tablet Take 100 mg by mouth daily.  12/26/15 12/25/16  [provider]  meclizine (ANTIVERT) 12.5 MG tablet Take 12.5 mg by mouth 3 (three) times daily as needed for dizziness or nausea.     [provider]  methocarbamol (ROBAXIN) 500 MG tablet Take 1 tablet  (500 mg total) by mouth 2 (two) times daily as needed for muscle spasms. 08/28/17   Eber HongMiller, Brian, MD  metoprolol succinate (TOPROL-XL) 25 MG 24 hr tablet Take 1 tablet by mouth daily 12/17/15   [provider]  metroNIDAZOLE (METROGEL) 0.75 % vaginal gel Place 1 Applicatorful vaginally 2 (two) times daily. 07/17/16   Raeford RazorKohut, Stephen, MD  montelukast (SINGULAIR) 10 MG tablet Take 1 tablet (10 mg total) by mouth every other day. Patient taking differently: Take 10 mg by mouth daily as needed (Alllergies).  01/05/15   Standley BrookingGoodrich, Daniel P, MD  mupirocin cream (BACTROBAN) 2 % Apply 1 application topically 2 (two) times daily. Until healed (prescribed 06/27/2016)    [provider]  nystatin cream (MYCOSTATIN) Apply 1 application topically 2 (two) times daily. Prescribed on 06/27/2016    [provider]  pantoprazole (PROTONIX) 20 MG tablet Take 1 tablet (20 mg total) by mouth daily. 07/17/16   Raeford RazorKohut, Stephen, MD  rosuvastatin (CRESTOR) 10 MG tablet Take one tablet daily 12/17/15   [provider]  terconazole (TERAZOL 7) 0.4 % vaginal cream Place 1 applicator vaginally at bedtime. 07/01/16   Lazaro ArmsEure, Luther H, MD    Family History Family History  Problem Relation Age of Onset  . Stroke Mother   . Cancer Father   . Stroke Brother     Social History Social History   Tobacco Use  . Smoking status: Never Smoker  . Smokeless tobacco: Never Used  Substance Use Topics  . Alcohol use: No  . Drug use: No     Allergies   Fluconazole; Sulfa antibiotics; and Penicillins   Review of Systems Review of Systems  Constitutional: Negative for chills and fever.  HENT: Positive for congestion, postnasal drip, rhinorrhea and sore throat. Negative for ear pain, sinus pressure, sinus pain, trouble swallowing and voice change.   Eyes: Negative for discharge.  Respiratory: Positive for cough. Negative for shortness of breath, wheezing and stridor.   Cardiovascular: Negative for  chest pain.  Gastrointestinal: Negative for abdominal pain.  Genitourinary: Negative.      Physical Exam Updated Vital Signs BP (!) 179/102 (BP Location: Left Arm)   Pulse (!) 112   Temp 99.1 F (37.3 C) (Oral)   Resp 18   Ht 5\' 5"  (1.651 m)   Wt 95.3 kg   SpO2 100%   BMI 34.95 kg/m   Physical Exam Constitutional:      Appearance: She is well-developed.  HENT:     Head: Normocephalic and atraumatic.  Right Ear: Tympanic membrane and ear canal normal.     Left Ear: Tympanic membrane and ear canal normal.     Nose: Mucosal edema, congestion and rhinorrhea present.     Mouth/Throat:     Mouth: Mucous membranes are moist.     Pharynx: Oropharynx is clear. Uvula midline. No oropharyngeal exudate or posterior oropharyngeal erythema.     Tonsils: No tonsillar abscesses.  Eyes:     Conjunctiva/sclera: Conjunctivae normal.  Cardiovascular:     Rate and Rhythm: Normal rate.     Heart sounds: Normal heart sounds.  Pulmonary:     Effort: Pulmonary effort is normal. No respiratory distress.     Breath sounds: No wheezing or rales.  Abdominal:     Palpations: Abdomen is soft.     Tenderness: There is no abdominal tenderness.  Musculoskeletal: Normal range of motion.  Lymphadenopathy:     Cervical: No cervical adenopathy.  Skin:    General: Skin is warm and dry.     Findings: No rash.  Neurological:     Mental Status: She is alert and oriented to person, place, and time.      ED Treatments / Results  Labs (all labs ordered are listed, but only abnormal results are displayed) Labs Reviewed - No data to display  EKG None  Radiology Dg Chest 2 View  Result Date: 12/24/2017 CLINICAL DATA:  Productive cough for several days EXAM: CHEST - 2 VIEW COMPARISON:  07/17/2016 FINDINGS: Cardiac shadow is within normal limits. Mild bibasilar atelectatic changes are noted. No sizable infiltrate or effusion is seen. No bony abnormality is noted. IMPRESSION: Mild bibasilar  atelectasis without focal confluent infiltrate. Electronically Signed   By: Alcide Clever M.D.   On: 12/24/2017 09:12    Procedures Procedures (including critical care time)  Medications Ordered in ED Medications  azithromycin (ZITHROMAX) tablet 500 mg (has no administration in time range)  benzonatate (TESSALON) capsule 200 mg (has no administration in time range)  famotidine (PEPCID) tablet 20 mg (has no administration in time range)     Initial Impression / Assessment and Plan / ED Course  I have reviewed the triage vital signs and the nursing notes.  Pertinent labs & imaging results that were available during my care of the patient were reviewed by me and considered in my medical decision making (see chart for details).     Imaging reviewed and discussed with patient.  Prior to discharge, she endorses seeing several streaks of blood in her sputum since arriving here.  She denies epistaxis or seeing blood in nasal secretions.  Given given this finding along with painful cough will cover for possible bacterial acute bronchitis.  She was started on Zithromax.  Also prescribed Tessalon.  She also endorses having an episode of acid reflux while here, this was treated with a dose of Pepcid.  Also advised she take her blood pressure medications as soon as she arrives home as her blood pressure is elevated this morning.  Discussed Coricidin brand products which will not interfere with her blood pressure.  She will contact her surgeon today to reschedule her elective fibroid surgery given her current respiratory infection.  Final Clinical Impressions(s) / ED Diagnoses   Final diagnoses:  Acute bronchitis, unspecified organism  URI, acute  Elevated blood pressure reading    ED Discharge Orders         Ordered    azelastine (ASTELIN) 0.1 % nasal spray  2 times daily PRN  12/24/17 0939    azithromycin (ZITHROMAX) 250 MG tablet  Daily     12/24/17 0939    benzonatate (TESSALON) 100 MG  capsule  3 times daily PRN     12/24/17 0939    famotidine (PEPCID) 20 MG tablet  2 times daily     12/24/17 0939           Burgess Amordol, Kristeena Meineke, PA-C 12/24/17 [id number]    Donnetta Hutchingook, Brian, MD 12/26/17 [phone]

## 2017-12-24 NOTE — ED Triage Notes (Addendum)
Pt reports nasal congestion and drainage for 2 days . Pt reports that throat is irritated from drainage and coughing mucous up. Pt concerned as she is scheduled for fibroid sx tomorrow

## 2017-12-24 NOTE — Discharge Instructions (Addendum)
Take the medications as prescribed for your symptoms.  Try the Astelin spray in place of your Flonase which may help your nasal congestion better.  I also suggested looking for Coricidin brand cough and cold products which will help you feel better while this infection is running its course and will not interfere with your blood pressure.  Your blood pressure is very high today, you need to take your blood pressure medication as soon as you arrive home.  Rest to make sure you are drinking plenty of fluids.  Take your next dose of Zithromax tomorrow as you have received today's dose here.

## 2019-09-26 IMAGING — DX DG CHEST 2V
2 series · 2 of 2 positions shown · non-contrast
Comparison: 07/17/2016

CLINICAL DATA: Productive cough for several days

EXAM:
CHEST - 2 VIEW

[chest pa]
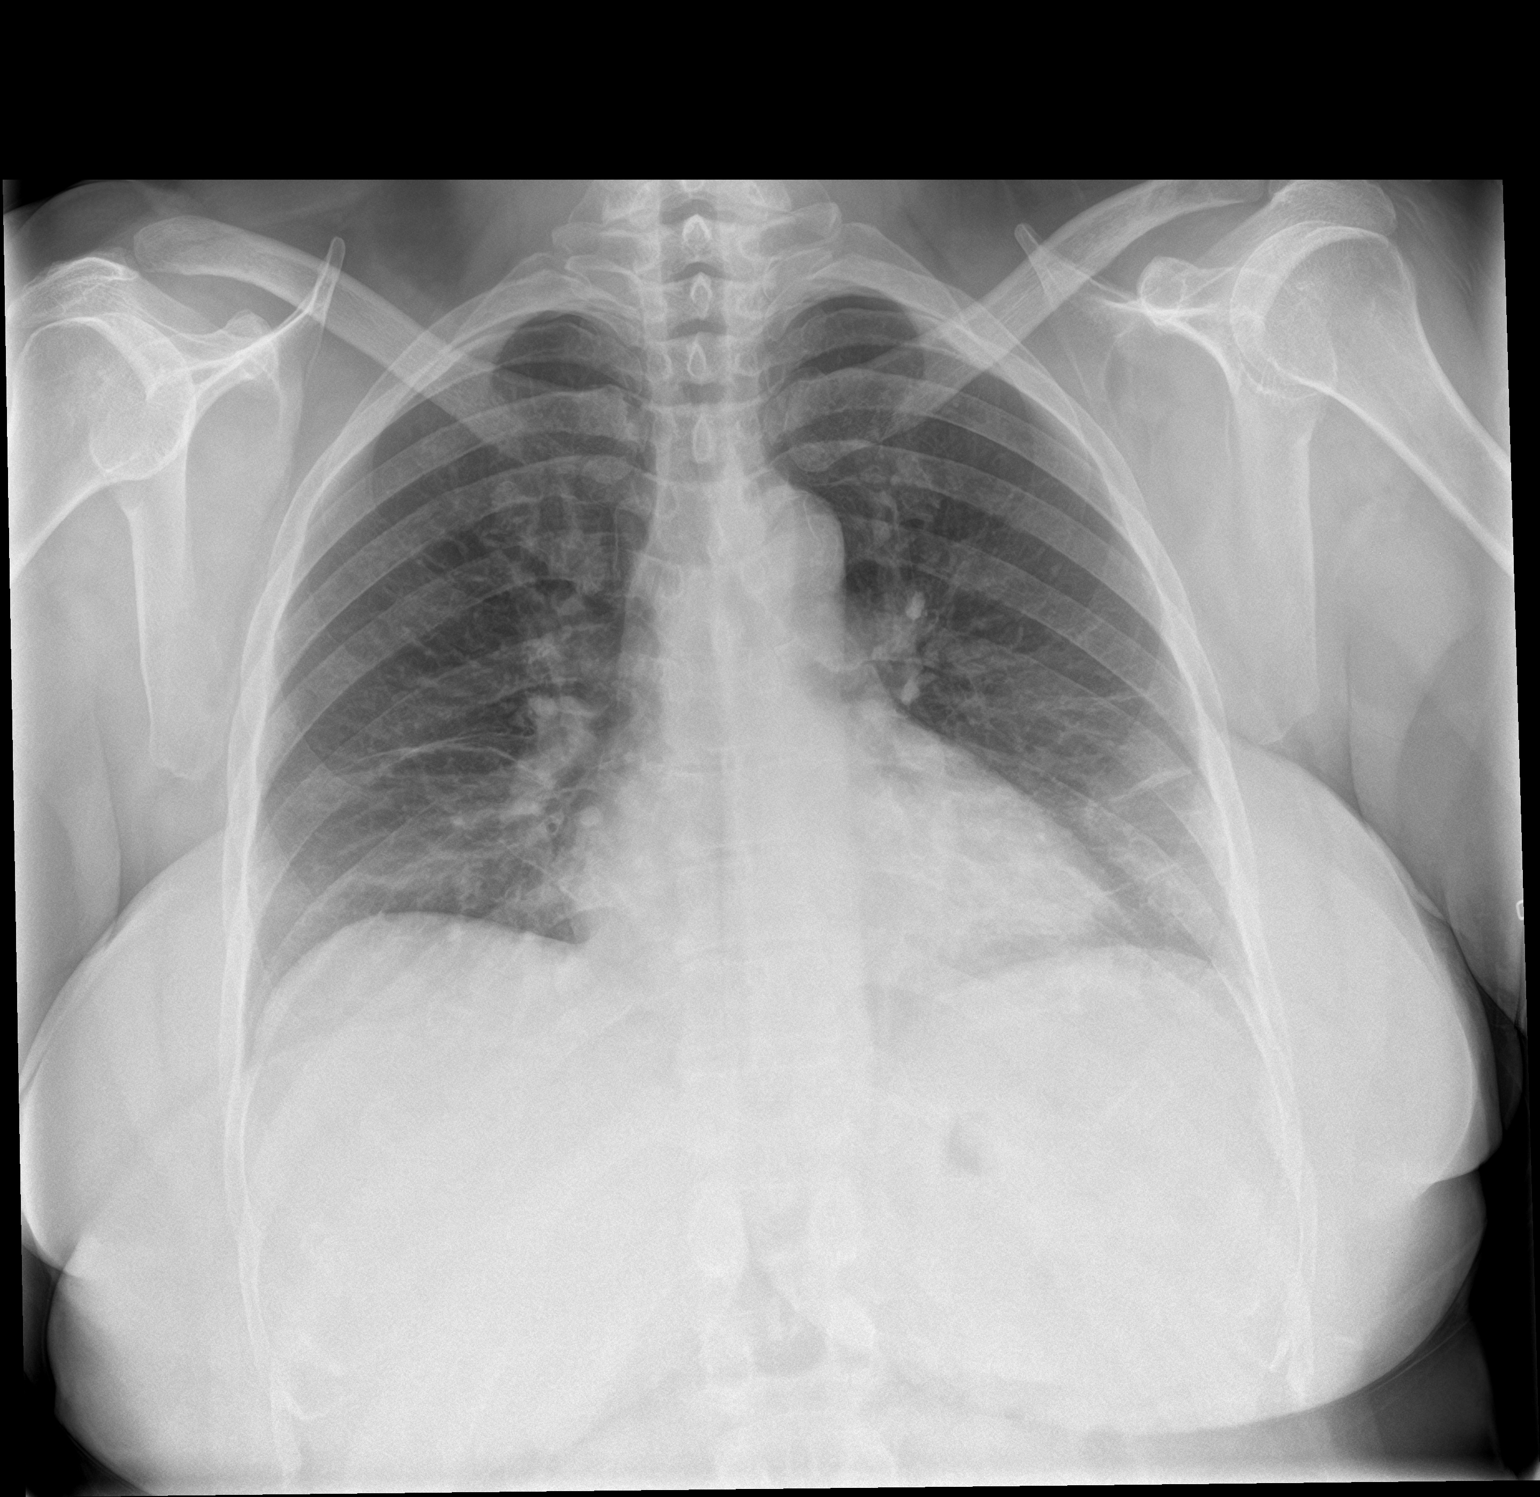

[chest lat]
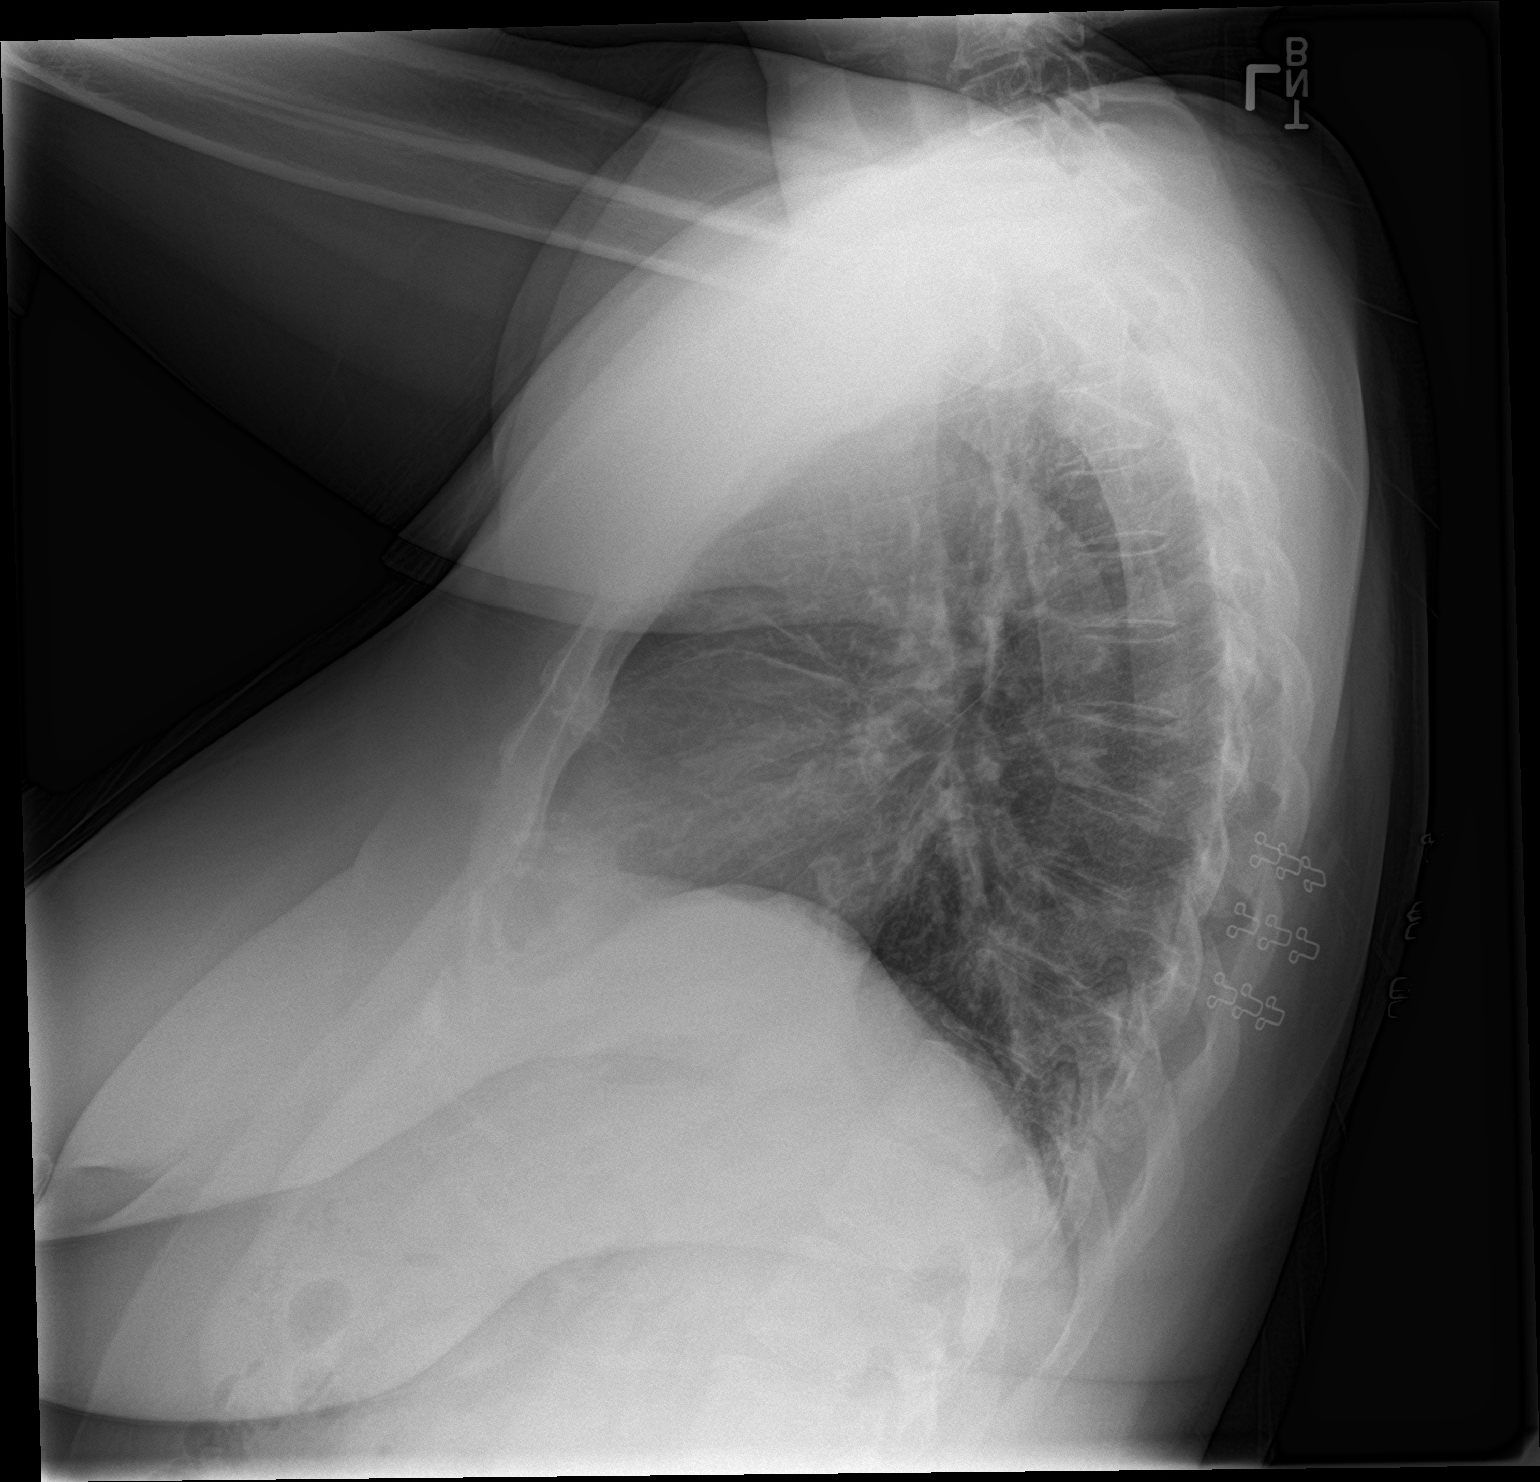

[2 of 2 positions shown; findings below may reference images not displayed]

FINDINGS: Cardiac shadow is within normal limits. Mild bibasilar atelectatic
changes are noted. No sizable infiltrate or effusion is seen. No
bony abnormality is noted.
IMPRESSION: Mild bibasilar atelectasis without focal confluent infiltrate.
